# Patient Record
Sex: Female | Born: 1970 | Race: Black or African American | Hispanic: No | Smoking: Light tobacco smoker
Health system: Southern US, Community
[De-identification: ages and names within clinical notes are randomized; demographics above are authoritative.]

## PROBLEM LIST (undated history)

## (undated) DIAGNOSIS — R87619 Unspecified abnormal cytological findings in specimens from cervix uteri: Secondary | ICD-10-CM

## (undated) DIAGNOSIS — R7303 Prediabetes: Secondary | ICD-10-CM

## (undated) DIAGNOSIS — K859 Acute pancreatitis without necrosis or infection, unspecified: Secondary | ICD-10-CM

## (undated) DIAGNOSIS — M1990 Unspecified osteoarthritis, unspecified site: Secondary | ICD-10-CM

## (undated) DIAGNOSIS — E079 Disorder of thyroid, unspecified: Secondary | ICD-10-CM

## (undated) DIAGNOSIS — I10 Essential (primary) hypertension: Secondary | ICD-10-CM

## (undated) DIAGNOSIS — J45909 Unspecified asthma, uncomplicated: Secondary | ICD-10-CM

## (undated) DIAGNOSIS — J302 Other seasonal allergic rhinitis: Secondary | ICD-10-CM

## (undated) DIAGNOSIS — R87810 Cervical high risk human papillomavirus (HPV) DNA test positive: Secondary | ICD-10-CM

## (undated) HISTORY — PX: ABDOMINAL HYSTERECTOMY: SHX81

## (undated) HISTORY — DX: Cervical high risk human papillomavirus (HPV) DNA test positive: R87.810

## (undated) HISTORY — DX: Unspecified asthma, uncomplicated: J45.909

## (undated) HISTORY — DX: Unspecified abnormal cytological findings in specimens from cervix uteri: R87.619

## (undated) HISTORY — DX: Prediabetes: R73.03

## (undated) HISTORY — PX: TUBAL LIGATION: SHX77

## (undated) HISTORY — DX: Disorder of thyroid, unspecified: E07.9

---

## 2012-04-03 DIAGNOSIS — D219 Benign neoplasm of connective and other soft tissue, unspecified: Secondary | ICD-10-CM | POA: Insufficient documentation

## 2016-04-30 DIAGNOSIS — Z720 Tobacco use: Secondary | ICD-10-CM | POA: Insufficient documentation

## 2017-10-01 ENCOUNTER — Other Ambulatory Visit: Payer: Self-pay

## 2017-10-01 ENCOUNTER — Encounter: Payer: Self-pay | Admitting: Emergency Medicine

## 2017-10-01 DIAGNOSIS — M7918 Myalgia, other site: Secondary | ICD-10-CM | POA: Diagnosis present

## 2017-10-01 DIAGNOSIS — Z5321 Procedure and treatment not carried out due to patient leaving prior to being seen by health care provider: Secondary | ICD-10-CM | POA: Insufficient documentation

## 2017-10-01 NOTE — ED Triage Notes (Signed)
Pt presents to ED with worsening generalized body aches for the past couple of weeks. Pain is mostly in her hands, back, feet, and legs. Dx with arthritis several years ago but pt states pain is worsening. Pt states she goes to a pain doctor but the patch they have prescribed its not helping.

## 2017-10-02 ENCOUNTER — Emergency Department
Admission: EM | Admit: 2017-10-02 | Discharge: 2017-10-02 | Disposition: A | Discharge status: Home / Self Care | Payer: Medicaid Other | Attending: Emergency Medicine | Admitting: Emergency Medicine

## 2017-10-02 ENCOUNTER — Encounter: Payer: Self-pay | Admitting: Emergency Medicine

## 2017-10-02 DIAGNOSIS — F17210 Nicotine dependence, cigarettes, uncomplicated: Secondary | ICD-10-CM | POA: Insufficient documentation

## 2017-10-02 DIAGNOSIS — M7918 Myalgia, other site: Secondary | ICD-10-CM | POA: Diagnosis present

## 2017-10-02 DIAGNOSIS — M069 Rheumatoid arthritis, unspecified: Secondary | ICD-10-CM | POA: Insufficient documentation

## 2017-10-02 DIAGNOSIS — I10 Essential (primary) hypertension: Secondary | ICD-10-CM | POA: Insufficient documentation

## 2017-10-02 HISTORY — DX: Acute pancreatitis without necrosis or infection, unspecified: K85.90

## 2017-10-02 HISTORY — DX: Unspecified osteoarthritis, unspecified site: M19.90

## 2017-10-02 HISTORY — DX: Other seasonal allergic rhinitis: J30.2

## 2017-10-02 HISTORY — DX: Essential (primary) hypertension: I10

## 2017-10-02 MED ORDER — PREDNISONE 10 MG PO TABS: 10.0000 mg | ORAL_TABLET | Freq: Every day | ORAL | 0 refills | Status: DC

## 2017-10-02 MED ORDER — DEXAMETHASONE SODIUM PHOSPHATE 10 MG/ML IJ SOLN
10.0000 mg | Freq: Once | INTRAMUSCULAR | Status: AC
Start: 2017-10-02 — End: 2017-10-02
  Administered 2017-10-02: 10 mg via INTRAMUSCULAR
  Filled 2017-10-02: qty 1

## 2017-10-02 NOTE — ED Triage Notes (Signed)
Pt c/o generalized body pain as well as hand and foot pain x2 weeks without relief of OTC medication. Pt Dx with arthritis and sts pain is similar but has worsened on the last weeks. Pt has pain patch to the left upper arm and reports she has not had relief from the patch.

## 2017-10-02 NOTE — ED Provider Notes (Signed)
[facility] Emergency Department Provider Note  ____________________________________________  Time seen: Approximately 7:55 PM  I have reviewed the triage vital signs and the nursing notes.   HISTORY  Chief Complaint Generalized Body Aches; Hand Pain; and Foot Pain    HPI Amy West is a 46 y.o. female who presents emergency department complaining of multi-joint pain.  Patient reports that she has a history of rheumatoid arthritis which is managed by pain management.  Patient states that she is never seen rheumatology or endocrinology for her rheumatoid arthritis.  Patient has never been trialed on methotrexate or biological or non-biological agents.  Patient reports that she has flares frequently.  She is on Percocet by mouth as needed and pain patches chronically.  Patient is unable to take NSAIDs due to extreme GI upset.  No history of gastric ulcer or renal failure.  Patient denies any recent trauma to any extremity.  She reports that she is having increased pain to her bilateral hands and wrists, bilateral feet and ankle.  No other complaints of headache, visual changes, neck pain, chest pain, shortness of breath, abdominal pain, nausea or vomiting.  Patient is taking her prescribed pain medications as prescribed.  Past Medical History:  Diagnosis Date  . Arthritis   . Hypertension   . Pancreatitis   . Seasonal allergies     There are no active problems to display for this patient.   Past Surgical History:  Procedure Laterality Date  . ABDOMINAL HYSTERECTOMY    . CESAREAN SECTION    . TUBAL LIGATION      Prior to Admission medications   Medication Sig Start Date End Date Taking? Authorizing Provider  predniSONE (DELTASONE) 10 MG tablet Take 1 tablet (10 mg total) daily by mouth. 10/02/17   Hines Kloss, Delorise Royals, PA-C    Allergies Patient has no known allergies.  History reviewed. No pertinent family history.  Social History Social History    Tobacco Use  . Smoking status: Light Tobacco Smoker    Types: Cigarettes  . Smokeless tobacco: Never Used  Substance Use Topics  . Alcohol use: No    Frequency: Never  . Drug use: No     Review of Systems  Constitutional: No fever/chills Eyes: No visual changes.  Cardiovascular: no chest pain. Respiratory: no cough. No SOB. Gastrointestinal: No abdominal pain.  No nausea, no vomiting.  Musculoskeletal: Positive for rheumatoid arthritis, with bilateral hand, wrist, ankle, feet pain. Skin: Negative for rash, abrasions, lacerations, ecchymosis. Neurological: Negative for headaches, focal weakness or numbness. 10-point ROS otherwise negative.  ____________________________________________   PHYSICAL EXAM:  VITAL SIGNS: ED Triage Vitals  Enc Vitals Group     BP 10/02/17 1901 116/70     Pulse Rate 10/02/17 1901 90     Resp 10/02/17 1901 17     Temp 10/02/17 1901 98.1 F (36.7 C)     Temp Source 10/02/17 1901 Oral     SpO2 10/02/17 1901 100 %     Weight 10/02/17 1901 164 lb (74.4 kg)     Height --      Head Circumference --      Peak Flow --      Pain Score 10/02/17 1916 10     Pain Loc --      Pain Edu? --      Excl. in GC? --      Constitutional: Alert and oriented. Well appearing and in no acute distress. Eyes: Conjunctivae are normal. PERRL. EOMI. Head: Atraumatic.Marland Kitchen  Neck: No stridor.   Cardiovascular: Normal rate, regular rhythm. Normal S1 and S2.  Good peripheral circulation. Respiratory: Normal respiratory effort without tachypnea or retractions. Lungs CTAB. Good air entry to the bases with no decreased or absent breath sounds. Musculoskeletal: Full range of motion to all extremities. No gross deformities appreciated.  No deformities, edema, ecchymosis, abrasions or contusions noted to bilateral hands, wrists, ankles, feet.  Radial pulse intact bilateral upper extremities, dorsalis pedis pulse intact bilateral lower extremity.  Sensation all digits bilateral  upper and lower extremities.  No tenderness to palpation.  No palpable abnormalities. Neurologic:  Normal speech and language. No gross focal neurologic deficits are appreciated.  Skin:  Skin is warm, dry and intact. No rash noted. Psychiatric: Mood and affect are normal. Speech and behavior are normal. Patient exhibits appropriate insight and judgement.   ____________________________________________   LABS (all labs ordered are listed, but only abnormal results are displayed)  Labs Reviewed - No data to display ____________________________________________  EKG   ____________________________________________  RADIOLOGY   No results found.  ____________________________________________    PROCEDURES  Procedure(s) performed:    Procedures    Medications  dexamethasone (DECADRON) injection 10 mg (not administered)     ____________________________________________   INITIAL IMPRESSION / ASSESSMENT AND PLAN / ED COURSE  Pertinent labs & imaging results that were available during my care of the patient were reviewed by me and considered in my medical decision making (see chart for details).  Review of the  [place]RS was performed in accordance of the [facility] prior to dispensing any controlled drugs.     Patient's diagnosis is consistent with rheumatoid arthritis flare.  Patient is here for increased pain from her rheumatoid arthritis.  Exam is reassuring with no indication of infection, acute injury.  Differential included osteoarthritis, rheumatoid arthritis, septic joint, gout.  No indication for labs or imaging at this time.  Discussed at length with patient about rheumatology and endocrinology and possible management techniques.  At this time, patient will follow-up with rheumatology or endocrinology as referred by her primary care.  Patient has Medicaid and referral will have to come from primary care.  Patient is given injection of steroids in the emergency department and  will be prescribed a prednisone taper for acute flare.  Patient will follow up with primary care and ultimately rheumatology or endocrinology..  Patient is given ED precautions to return to the ED for any worsening or new symptoms.     ____________________________________________  FINAL CLINICAL IMPRESSION(S) / ED DIAGNOSES  Final diagnoses:  Rheumatoid arthritis flare (HCC)      NEW MEDICATIONS STARTED DURING THIS VISIT:  ED Discharge Orders        Ordered    predniSONE (DELTASONE) 10 MG tablet  Daily    Comments:  Take 6 pills x 2 days, 5 pills x 2 days, 4 pills x 2 days, 3 pills x 2 days, 2 pills x 2 days, and 1 pill x 2 days   10/02/17 2010          This chart was dictated using voice recognition software/Dragon. Despite best efforts to proofread, errors can occur which can change the meaning. Any change was purely unintentional.    Racheal Patches, PA-C 10/02/17 2014    Phineas Semen, MD 10/02/17 2108

## 2017-10-14 ENCOUNTER — Ambulatory Visit: Payer: Medicaid Other | Admitting: Family Medicine

## 2017-10-16 ENCOUNTER — Ambulatory Visit: Payer: Medicaid Other | Admitting: Unknown Physician Specialty

## 2017-10-16 ENCOUNTER — Ambulatory Visit (INDEPENDENT_AMBULATORY_CARE_PROVIDER_SITE_OTHER): Payer: Medicaid Other | Admitting: Unknown Physician Specialty

## 2017-10-16 ENCOUNTER — Encounter: Payer: Self-pay | Admitting: Unknown Physician Specialty

## 2017-10-16 DIAGNOSIS — M2550 Pain in unspecified joint: Secondary | ICD-10-CM | POA: Diagnosis not present

## 2017-10-16 DIAGNOSIS — F321 Major depressive disorder, single episode, moderate: Secondary | ICD-10-CM | POA: Diagnosis not present

## 2017-10-16 DIAGNOSIS — G894 Chronic pain syndrome: Secondary | ICD-10-CM

## 2017-10-16 DIAGNOSIS — R5383 Other fatigue: Secondary | ICD-10-CM | POA: Diagnosis not present

## 2017-10-16 DIAGNOSIS — G8929 Other chronic pain: Secondary | ICD-10-CM | POA: Insufficient documentation

## 2017-10-16 DIAGNOSIS — N761 Subacute and chronic vaginitis: Secondary | ICD-10-CM | POA: Diagnosis not present

## 2017-10-16 LAB — WET PREP FOR TRICH, YEAST, CLUE
Clue Cell Exam: POSITIVE — AB
Trichomonas Exam: NEGATIVE
Yeast Exam: NEGATIVE

## 2017-10-16 MED ORDER — METRONIDAZOLE 500 MG PO TABS: 500.0000 mg | ORAL_TABLET | Freq: Three times a day (TID) | ORAL | 1 refills | Status: DC

## 2017-10-16 MED ORDER — DULOXETINE HCL 60 MG PO CPEP: 60.0000 mg | ORAL_CAPSULE | Freq: Every day | ORAL | 1 refills | Status: DC

## 2017-10-16 MED ORDER — FLUCONAZOLE 150 MG PO TABS: 150.0000 mg | ORAL_TABLET | Freq: Once | ORAL | 12 refills | Status: AC

## 2017-10-16 NOTE — Assessment & Plan Note (Signed)
Positive clue cells.  Rx for Metronidazole with a refill.  Chronic issues.  Diflucan with refills

## 2017-10-16 NOTE — Progress Notes (Signed)
BP 103/64   Pulse 91   Temp 98.3 F (36.8 C) (Oral)   Ht 5' 4.1" (1.628 m)   Wt 167 lb 11.2 oz (76.1 kg)   LMP  (LMP Unknown)   SpO2 95%   BMI 28.70 kg/m    Subjective:    Patient ID: Amy West, female    DOB: 1971/06/04, 46 y.o.   MRN: [id number]  HPI: Amy West is a 46 y.o. female  Chief Complaint  Patient presents with  . Establish Care    pt states she is still having vaginal itching, went to urgent care about 2 weeks go and was given amoxicillin  . Labs Only    pt is requesting to have her thyroid checked   Chief complaint today is hand and feet swelling secondary to RA.  Gets a patch and percocet at the pain clinic.  She presented to the ER and received and received steroids.  Helped some.  States a "dull pain" sits there "throughy the bone."  Gets Methadone, Butrans, and Oxycodone through the pain clinic and doses are adjusted.    Vaginal Itching  The patient's primary symptoms include genital itching. Primary symptoms comment: Treated for BV 2 weeks ago. This is a recurrent problem. The problem occurs constantly. The problem has been gradually improving. The problem affects both sides. She is not pregnant. Pertinent negatives include no abdominal pain, constipation, fever, rash, urgency or vomiting. Nothing aggravates the symptoms. She is sexually active (one partner). No, her partner does not have an STD. She uses hysterectomy for contraception. Her past medical history is significant for vaginosis.   Depression Takes Sertraline and needs a refill.  Has tried Prozac and Wellbutrin before.  Feels her depression is under fair control  Depression screen [facility] 2/9 10/16/2017  Decreased Interest 3  Down, Depressed, Hopeless 0  PHQ - 2 Score 3  Altered sleeping 0  Tired, decreased energy 3  Change in appetite 1  Feeling bad or failure about yourself  3  Trouble concentrating 0  Moving slowly or fidgety/restless 3  Suicidal thoughts 0  PHQ-9 Score 13   Relevant  past medical, surgical, family and social history reviewed and updated as indicated. Interim medical history since our last visit reviewed. Allergies and medications reviewed and updated.  Review of Systems  Constitutional: Positive for fatigue. Negative for fever.  HENT: Negative.   Eyes: Negative.   Respiratory: Negative.   Cardiovascular: Negative.   Gastrointestinal: Negative for abdominal pain, constipation and vomiting.  Endocrine: Negative.   Genitourinary: Negative for urgency.  Musculoskeletal: Positive for arthralgias, joint swelling and myalgias.  Skin: Negative for rash.    Per HPI unless specifically indicated above     Objective:    BP 103/64   Pulse 91   Temp 98.3 F (36.8 C) (Oral)   Ht 5' 4.1" (1.628 m)   Wt 167 lb 11.2 oz (76.1 kg)   LMP  (LMP Unknown)   SpO2 95%   BMI 28.70 kg/m   Wt Readings from Last 3 Encounters:  10/16/17 167 lb 11.2 oz (76.1 kg)  10/02/17 164 lb (74.4 kg)  10/01/17 164 lb (74.4 kg)    Physical Exam  Constitutional: She is oriented to person, place, and time. She appears well-developed and well-nourished. No distress.  HENT:  Head: Normocephalic and atraumatic.  Eyes: Conjunctivae and lids are normal. Right eye exhibits no discharge. Left eye exhibits no discharge. No scleral icterus.  Neck: Normal range of motion. Neck supple.  No JVD present. Carotid bruit is not present.  Cardiovascular: Normal rate, regular rhythm and normal heart sounds.  Pulmonary/Chest: Effort normal and breath sounds normal.  Abdominal: Normal appearance. There is no splenomegaly or hepatomegaly.  Genitourinary: Vaginal discharge found.  Musculoskeletal: Normal range of motion.  Neurological: She is alert and oriented to person, place, and time.  Skin: Skin is warm, dry and intact. No rash noted. No pallor.  Psychiatric: She has a normal mood and affect. Her behavior is normal. Judgment and thought content normal.    No results found for this or any  previous visit.    Assessment & Plan:   Problem List Items Addressed This Visit      Unprioritized   Chronic pain    Per pain clinic.        Relevant Medications   oxyCODONE-acetaminophen (PERCOCET) 10-325 MG tablet   BUTRANS 15 MCG/HR PTWK   sertraline (ZOLOFT) 50 MG tablet   methadone (DOLOPHINE) 10 MG tablet   DULoxetine (CYMBALTA) 60 MG capsule   Chronic vaginitis    Positive clue cells.  Rx for Metronidazole with a refill.  Chronic issues.  Diflucan with refills      Relevant Orders   WET PREP FOR TRICH, YEAST, CLUE   Depression, major, single episode, moderate ([place])    Change from Sertraline to Cymbalta due to chronic pain      Relevant Medications   sertraline (ZOLOFT) 50 MG tablet   DULoxetine (CYMBALTA) 60 MG capsule   Other Relevant Orders   TSH   Fatigue   Joint pain    Hand and feet.  States RA has been diagnosed in the past.  Have no labs.  Check today.  Refer if appropriate      Relevant Orders   CBC with Differential/Platelet   Comprehensive metabolic panel   TSH   Sed Rate (ESR)   ANA w/Reflex   Rheumatoid Arthritis Profile       Follow up plan: Return in about 4 weeks (around 11/13/2017).

## 2017-10-16 NOTE — Assessment & Plan Note (Signed)
Change from Sertraline to Cymbalta due to chronic pain

## 2017-10-16 NOTE — Assessment & Plan Note (Addendum)
Hand and feet.  States RA has been diagnosed in the past.  Have no labs.  Check today.  Refer if appropriate

## 2017-10-16 NOTE — Assessment & Plan Note (Addendum)
Per pain clinic 

## 2017-10-17 ENCOUNTER — Encounter: Payer: Self-pay | Admitting: Unknown Physician Specialty

## 2017-10-18 LAB — COMPREHENSIVE METABOLIC PANEL
A/G RATIO: 1.8 (ref 1.2–2.2)
ALT: 10 IU/L (ref 0–32)
AST: 15 IU/L (ref 0–40)
Albumin: 4.1 g/dL (ref 3.5–5.5)
Alkaline Phosphatase: 86 IU/L (ref 39–117)
BUN/Creatinine Ratio: 18 (ref 9–23)
BUN: 10 mg/dL (ref 6–24)
Bilirubin Total: 0.3 mg/dL (ref 0.0–1.2)
CALCIUM: 9.2 mg/dL (ref 8.7–10.2)
CO2: 25 mmol/L (ref 20–29)
CREATININE: 0.56 mg/dL — AB (ref 0.57–1.00)
Chloride: 104 mmol/L (ref 96–106)
GFR, EST AFRICAN AMERICAN: 128 mL/min/{1.73_m2} (ref >59)
GFR, EST NON AFRICAN AMERICAN: 111 mL/min/{1.73_m2} (ref >59)
Globulin, Total: 2.3 g/dL (ref 1.5–4.5)
Glucose: 74 mg/dL (ref 65–99)
POTASSIUM: 4.3 mmol/L (ref 3.5–5.2)
Sodium: 141 mmol/L (ref 134–144)
TOTAL PROTEIN: 6.4 g/dL (ref 6.0–8.5)

## 2017-10-18 LAB — SEDIMENTATION RATE: SED RATE: 13 mm/h (ref 0–32)

## 2017-10-18 LAB — CBC WITH DIFFERENTIAL/PLATELET
BASOS ABS: 0 10*3/uL (ref 0.0–0.2)
Basos: 0 %
EOS (ABSOLUTE): 0.4 10*3/uL (ref 0.0–0.4)
Eos: 4 %
HEMATOCRIT: 39.2 % (ref 34.0–46.6)
Hemoglobin: 12.3 g/dL (ref 11.1–15.9)
IMMATURE GRANS (ABS): 0 10*3/uL (ref 0.0–0.1)
IMMATURE GRANULOCYTES: 0 %
LYMPHS: 31 %
Lymphocytes Absolute: 3.1 10*3/uL (ref 0.7–3.1)
MCH: 26.8 pg (ref 26.6–33.0)
MCHC: 31.4 g/dL — ABNORMAL LOW (ref 31.5–35.7)
MCV: 85 fL (ref 79–97)
MONOCYTES: 11 %
Monocytes Absolute: 1.2 10*3/uL — ABNORMAL HIGH (ref 0.1–0.9)
NEUTROS PCT: 54 %
Neutrophils Absolute: 5.5 10*3/uL (ref 1.4–7.0)
PLATELETS: 366 10*3/uL (ref 150–379)
RBC: 4.59 x10E6/uL (ref 3.77–5.28)
RDW: 15.8 % — AB (ref 12.3–15.4)
WBC: 10.3 10*3/uL (ref 3.4–10.8)

## 2017-10-18 LAB — RHEUMATOID ARTHRITIS PROFILE: CYCLIC CITRULLIN PEPTIDE AB: 4 U (ref 0–19)

## 2017-10-18 LAB — TSH: TSH: 0.602 u[IU]/mL (ref 0.450–4.500)

## 2017-10-18 LAB — ANA W/REFLEX: ANA: NEGATIVE

## 2017-10-21 ENCOUNTER — Telehealth: Payer: Self-pay

## 2017-10-21 NOTE — Telephone Encounter (Signed)
An MRI won't be approved for her pain everywhere.  If would need to be specific and persistent.  Rheumatology would be the next step.  But they may not take the referral.  We can try

## 2017-10-21 NOTE — Telephone Encounter (Signed)
Routing to provider  

## 2017-10-21 NOTE — Telephone Encounter (Signed)
Pt called back results given to pt per notes of Brittnnt,CMA on 12/3. Pt verbalized understanding. Pt wants to know if MRI could be done regarding test results showing no signs of rheumatoid arthritis. Pt states she has been diagnosed with rheumatoid arthritis for years.Flow coordinator, Ephriam Knuckles contacted at the office about this matter.

## 2017-10-21 NOTE — Telephone Encounter (Signed)
Tried calling patient to let her know what Amy West said. Patient did not answer so I left a VM asking for patient to please call back. OK for PEC to let the patient know what Amy West said when she calls back. Also find out if the patient is ok with going to rheumatology please.

## 2017-10-21 NOTE — Telephone Encounter (Signed)
Copied from CRM [id number]. Topic: Quick Communication - Lab Results >> Oct 21, 2017 10:21 AM Debroah Loop wrote: Called patient to inform them of n/a lab results. Saw Gabriel Cirri for new pt appt and wants lab results.   Attempted to reach patient to let her know about results. A letter was generated and sent to patient by Gabriel Cirri, FNP with results. [name] states on the letter, "these are normal results, showing no signs of rheumatoid arthritis." Patient did not answer and no VM came up to leave a VM. OK for PEC to let patient know the information stated above if she returns the call.

## 2017-10-22 ENCOUNTER — Encounter (INDEPENDENT_AMBULATORY_CARE_PROVIDER_SITE_OTHER): Payer: Self-pay

## 2017-10-22 ENCOUNTER — Ambulatory Visit (INDEPENDENT_AMBULATORY_CARE_PROVIDER_SITE_OTHER): Payer: Medicaid Other | Admitting: Unknown Physician Specialty

## 2017-10-22 ENCOUNTER — Encounter: Payer: Self-pay | Admitting: Unknown Physician Specialty

## 2017-10-22 ENCOUNTER — Telehealth: Payer: Self-pay | Admitting: Unknown Physician Specialty

## 2017-10-22 VITALS — BP 127/82 | HR 76 | Temp 98.5°F | Wt 166.4 lb

## 2017-10-22 DIAGNOSIS — G894 Chronic pain syndrome: Secondary | ICD-10-CM | POA: Diagnosis not present

## 2017-10-22 DIAGNOSIS — F321 Major depressive disorder, single episode, moderate: Secondary | ICD-10-CM

## 2017-10-22 DIAGNOSIS — M7989 Other specified soft tissue disorders: Secondary | ICD-10-CM

## 2017-10-22 DIAGNOSIS — N9089 Other specified noninflammatory disorders of vulva and perineum: Secondary | ICD-10-CM | POA: Diagnosis not present

## 2017-10-22 MED ORDER — LIDOCAINE 0.5 % EX GEL: CUTANEOUS | 1 refills | Status: DC

## 2017-10-22 NOTE — Assessment & Plan Note (Signed)
Sick with Cymbalta.  Give bottle of 30 mg then transition to 60 mg.

## 2017-10-22 NOTE — Telephone Encounter (Signed)
Let pt. Know it is OTC
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
## 2017-10-22 NOTE — Telephone Encounter (Signed)
Routing to provider  

## 2017-10-22 NOTE — Telephone Encounter (Signed)
Copied from CRM [id number]. Topic: Inquiry >> Oct 22, 2017 11:35 AM Alexander Bergeron B wrote: Reason for CRM: ellen from [facility] in [place] said that the lidocaine is not available, and is wanting Dr. Jamesetta Orleans to send in another Rx for them to fill

## 2017-10-22 NOTE — Telephone Encounter (Signed)
Patient notified. See OV note from today.

## 2017-10-22 NOTE — Assessment & Plan Note (Addendum)
Refer to rheumatology for past diagnsis of RA and swelling of hands.

## 2017-10-23 ENCOUNTER — Telehealth: Payer: Self-pay | Admitting: Unknown Physician Specialty

## 2017-10-23 LAB — HSV(HERPES SIMPLEX VRS) I + II AB-IGG
HSV 1 Glycoprotein G Ab, IgG: 23.2 index — ABNORMAL HIGH (ref 0.00–0.90)
HSV 2 IgG, Type Spec: 9.16 index — ABNORMAL HIGH (ref 0.00–0.90)

## 2017-10-23 LAB — RPR: RPR: NONREACTIVE

## 2017-10-23 LAB — LYME AB/WESTERN BLOT REFLEX: LYME DISEASE AB, QUANT, IGM: <0.8 index (ref 0.00–0.79)

## 2017-10-23 LAB — HIV ANTIBODY (ROUTINE TESTING W REFLEX): HIV Screen 4th Generation wRfx: NONREACTIVE

## 2017-10-23 MED ORDER — VALACYCLOVIR HCL 500 MG PO TABS: 500.0000 mg | ORAL_TABLET | Freq: Two times a day (BID) | ORAL | 0 refills | Status: DC

## 2017-10-23 NOTE — Progress Notes (Signed)
Patient notified of results by phone.

## 2017-10-23 NOTE — Telephone Encounter (Signed)
Discussed with pt positve herpes titers.  Discussed with pt positive herpes.

## 2017-10-23 NOTE — Telephone Encounter (Signed)
Patient notified

## 2017-11-05 ENCOUNTER — Telehealth: Payer: Self-pay

## 2017-11-05 MED ORDER — VALACYCLOVIR HCL 500 MG PO TABS: 500.0000 mg | ORAL_TABLET | Freq: Every day | ORAL | 3 refills | Status: DC

## 2017-11-05 NOTE — Telephone Encounter (Signed)
Patient notified

## 2017-11-05 NOTE — Telephone Encounter (Signed)
She has andtibodies for both I and II which doesn't mean she has an outbreak.  Medication can be continued to help prevent transmission or discontinued and take as needed.

## 2017-11-05 NOTE — Telephone Encounter (Signed)
Copied from CRM [id number]. Topic: Quick Communication - Lab Results >> Nov 04, 2017  1:45 PM Amy West wrote: Pt has more questions concerning lab results from 10-22-17   Called and spoke to patient. Patient wants to know what herpes she has, 1 or 2. Patient also wants to know what she needs to do after completing the medication that she was put on for the herpes.

## 2017-11-05 NOTE — Telephone Encounter (Signed)
Folic acid and MVI is OTC.

## 2017-11-05 NOTE — Telephone Encounter (Signed)
Called and let patient know what [name] said. Patient wants to stay on the medication continuously so she would like a prescription sent in for it to [facility]. Patient also states that she forgot to mention to [name] that she has been taking prescription folic acid and prescription vitamin. Patient wants to know if she can have these sent in as well.

## 2017-11-25 ENCOUNTER — Telehealth: Payer: Self-pay | Admitting: Unknown Physician Specialty

## 2017-11-25 NOTE — Telephone Encounter (Signed)
Patient notified

## 2017-11-25 NOTE — Telephone Encounter (Signed)
Yes, if they do subside

## 2017-11-25 NOTE — Telephone Encounter (Signed)
Copied from CRM [id number]. Topic: Inquiry >> Nov 25, 2017 11:17 AM Amy West, NT wrote: Reason for CRM: Patient called to speak with Gabriel Cirri about her headaches and weight lost. If she could give her a call back at [phone]

## 2017-11-25 NOTE — Telephone Encounter (Signed)
No, not a normal side-effect.  I would stop the Valtrex and see if the symptoms go away

## 2017-11-25 NOTE — Telephone Encounter (Signed)
Is there another medication the patient can take after symptoms subside?

## 2017-11-25 NOTE — Telephone Encounter (Signed)
Called and spoke to patient. She states that since starting the valtrex, she has been having frequent headaches that make her sleep all day and loosing weight. Patient would like to know if these are normal side effects and what she should do.

## 2017-12-10 ENCOUNTER — Ambulatory Visit: Payer: Medicaid Other | Admitting: Unknown Physician Specialty

## 2017-12-13 ENCOUNTER — Ambulatory Visit: Payer: Medicaid Other | Admitting: Unknown Physician Specialty

## 2017-12-16 ENCOUNTER — Encounter: Payer: Medicaid Other | Admitting: Unknown Physician Specialty

## 2018-01-27 ENCOUNTER — Telehealth: Payer: Self-pay | Admitting: Unknown Physician Specialty

## 2018-01-27 MED ORDER — SERTRALINE HCL 50 MG PO TABS: 50.0000 mg | ORAL_TABLET | Freq: Every day | ORAL | 3 refills | Status: DC

## 2018-01-27 NOTE — Telephone Encounter (Signed)
Copied from CRM [id number]. Topic: Quick Communication - See Telephone Encounter >> Jan 27, 2018  2:46 PM Windy Kalata, NT wrote: CRM for notification. See Telephone encounter for:  01/27/18.  Patient is calling and is requesting Gabriel Cirri nurse give her a call. She states DULoxetine (CYMBALTA) 60 MG capsules is not working for her and would like to be put back on Zoloft. Please contact patient.  [id number] - [place], [place] - [address] AT [facility] OF [address]  [address] [place] [address]  Phone: [phone] Fax: [phone]

## 2018-01-27 NOTE — Telephone Encounter (Signed)
D/c'd cymbalta and sent in previous dose of zoloft for her to go back on

## 2018-01-27 NOTE — Telephone Encounter (Signed)
Patient notified

## 2018-03-02 ENCOUNTER — Encounter: Payer: Self-pay | Admitting: Emergency Medicine

## 2018-03-02 ENCOUNTER — Emergency Department
Admission: EM | Admit: 2018-03-02 | Discharge: 2018-03-02 | Disposition: A | Discharge status: Home / Self Care | Payer: Medicaid Other | Attending: Emergency Medicine | Admitting: Emergency Medicine

## 2018-03-02 ENCOUNTER — Emergency Department: Payer: Medicaid Other

## 2018-03-02 DIAGNOSIS — M79604 Pain in right leg: Secondary | ICD-10-CM

## 2018-03-02 DIAGNOSIS — Y929 Unspecified place or not applicable: Secondary | ICD-10-CM | POA: Insufficient documentation

## 2018-03-02 DIAGNOSIS — E039 Hypothyroidism, unspecified: Secondary | ICD-10-CM | POA: Diagnosis not present

## 2018-03-02 DIAGNOSIS — W010XXA Fall on same level from slipping, tripping and stumbling without subsequent striking against object, initial encounter: Secondary | ICD-10-CM | POA: Insufficient documentation

## 2018-03-02 DIAGNOSIS — Z79899 Other long term (current) drug therapy: Secondary | ICD-10-CM | POA: Insufficient documentation

## 2018-03-02 DIAGNOSIS — F17210 Nicotine dependence, cigarettes, uncomplicated: Secondary | ICD-10-CM | POA: Insufficient documentation

## 2018-03-02 DIAGNOSIS — I10 Essential (primary) hypertension: Secondary | ICD-10-CM | POA: Diagnosis not present

## 2018-03-02 DIAGNOSIS — J45909 Unspecified asthma, uncomplicated: Secondary | ICD-10-CM | POA: Insufficient documentation

## 2018-03-02 DIAGNOSIS — Y939 Activity, unspecified: Secondary | ICD-10-CM | POA: Insufficient documentation

## 2018-03-02 DIAGNOSIS — M79661 Pain in right lower leg: Secondary | ICD-10-CM | POA: Diagnosis present

## 2018-03-02 DIAGNOSIS — W19XXXA Unspecified fall, initial encounter: Secondary | ICD-10-CM

## 2018-03-02 DIAGNOSIS — Y999 Unspecified external cause status: Secondary | ICD-10-CM | POA: Insufficient documentation

## 2018-03-02 MED ORDER — IBUPROFEN 600 MG PO TABS
600.0000 mg | ORAL_TABLET | Freq: Once | ORAL | Status: AC
  Administered 2018-03-02: 600 mg via ORAL
  Filled 2018-03-02: qty 1

## 2018-03-02 MED ORDER — IBUPROFEN 600 MG PO TABS: 600.0000 mg | ORAL_TABLET | Freq: Four times a day (QID) | ORAL | 0 refills | Status: DC | PRN

## 2018-03-02 NOTE — Discharge Instructions (Addendum)
Please rest ice and elevate the lower extremity.  Take ibuprofen every 8 hours as needed for pain with food for 1 week.  Follow-up with PCP or orthopedics if no improvement 1 week.

## 2018-03-02 NOTE — ED Provider Notes (Signed)
[facility] EMERGENCY DEPARTMENT Provider Note   CSN: [id number] Arrival date & time: 03/02/18  1152     History   Chief Complaint Chief Complaint  Patient presents with  . Fall  . Leg Pain    HPI Amy West is a 47 y.o. female.  Midshaft of the tib-fib region.  States that she fell this morning after slipping.  She denies any ankle or knee pain.  No hip pain.  No other injury to her body.  Pain is 3 out of 10.  She is ambulatory but with sharp pain when she walks. Patient has not had any medications for pain. HPI  Past Medical History:  Diagnosis Date  . Arthritis   . Asthma   . Hypertension   . Pancreatitis   . Pre-diabetes   . Seasonal allergies   . Thyroid disease    hypothyroid    Patient Active Problem List   Diagnosis Date Noted  . Chronic pain 10/16/2017  . Chronic vaginitis 10/16/2017  . Depression, major, single episode, moderate (HCC) 10/16/2017  . Joint pain 10/16/2017  . Fatigue 10/16/2017    Past Surgical History:  Procedure Laterality Date  . ABDOMINAL HYSTERECTOMY    . CESAREAN SECTION    . TUBAL LIGATION       OB History   None      Home Medications    Prior to Admission medications   Medication Sig Start Date End Date Taking? Authorizing Provider  BUTRANS 15 MCG/HR PTWK PLACE ONE PATCH ONTO THE SKIN EVERY 7 DAYS 10/14/17   [provider]  hydrochlorothiazide (HYDRODIURIL) 25 MG tablet Take 25 mg by mouth daily.    [provider]  ibuprofen (ADVIL,MOTRIN) 600 MG tablet Take 1 tablet (600 mg total) by mouth every 6 (six) hours as needed for moderate pain. 03/02/18   Evon Slack, PA-C  Lidocaine 0.5 % GEL Apply pea sized amount every 2 hours as needed 10/22/17   Gabriel Cirri, NP  methadone (DOLOPHINE) 10 MG tablet Take 10 mg by mouth every 8 (eight) hours.    [provider]  metroNIDAZOLE (FLAGYL) 500 MG tablet Take 1 tablet (500 mg total) by mouth 3 (three) times daily. 10/16/17    Gabriel Cirri, NP  oxyCODONE-acetaminophen (PERCOCET) 10-325 MG tablet TAKE ONE TABLET BY MOUTH 1-2 TIMES DAILY FOR 31 DAYS 09/02/17   [provider]  sertraline (ZOLOFT) 50 MG tablet Take 1 tablet (50 mg total) by mouth daily. 01/27/18   Particia Nearing, PA-C  valACYclovir (VALTREX) 500 MG tablet Take 1 tablet (500 mg total) by mouth daily. 11/05/17   Gabriel Cirri, NP    Family History Family History  Problem Relation Age of Onset  . Cancer Mother        lung  . Hypertension Mother   . Diabetes Mother   . Hypertension Father     Social History Social History   Tobacco Use  . Smoking status: Light Tobacco Smoker    Types: Cigarettes  . Smokeless tobacco: Never Used  Substance Use Topics  . Alcohol use: No    Frequency: Never  . Drug use: No     Allergies   Patient has no known allergies.   Review of Systems Review of Systems  Constitutional: Negative for activity change.  Eyes: Negative for pain and visual disturbance.  Respiratory: Negative for shortness of breath.   Cardiovascular: Negative for chest pain and leg swelling.  Gastrointestinal: Negative for abdominal pain.  Genitourinary: Negative for flank pain and pelvic pain.  Musculoskeletal: Positive for myalgias. Negative for arthralgias, gait problem, joint swelling, neck pain and neck stiffness.  Skin: Negative for wound.  Neurological: Negative for dizziness, syncope, weakness, light-headedness, numbness and headaches.  Psychiatric/Behavioral: Negative for confusion and decreased concentration.     Physical Exam Updated Vital Signs BP 129/84 (BP Location: Left Arm)   Pulse 81   Temp 98.2 F (36.8 C) (Oral)   Resp 16   Ht 5\' 3"  (1.6 m)   Wt 72.6 kg (160 lb)   LMP  (LMP Unknown)   SpO2 96%   BMI 28.34 kg/m   Physical Exam  Constitutional: She is oriented to person, place, and time. She appears well-developed and well-nourished.  HENT:  Head: Normocephalic and atraumatic.    Right Ear: External ear normal.  Left Ear: External ear normal.  Nose: Nose normal.  Eyes: Pupils are equal, round, and reactive to light. Conjunctivae and EOM are normal.  Neck: Normal range of motion.  Cardiovascular: Normal rate.  Pulmonary/Chest: Effort normal and breath sounds normal. No respiratory distress.  Abdominal: Soft. There is no tenderness.  Musculoskeletal: Normal range of motion. She exhibits no edema or deformity.  Tenderness palpation along the mid tib-fib region along the anterior tibia as well as the lateral part of the lower leg along the fibular shaft.  No swelling noted.  She is nontender to palpation throughout the ankle or knee.  There is no swelling throughout the knee or ankle.  She is ambulatory with minimal antalgic gait.  Neurological: She is alert and oriented to person, place, and time. No cranial nerve deficit. Coordination normal.  Skin: Skin is warm and dry. No rash noted.  Psychiatric: She has a normal mood and affect. Her behavior is normal.     ED Treatments / Results  Labs (all labs ordered are listed, but only abnormal results are displayed) Labs Reviewed - No data to display  EKG None  Radiology Dg Tibia/fibula Right  Result Date: 03/02/2018 CLINICAL DATA:  RIGHT leg pain after mechanical fall this morning. EXAM: RIGHT TIBIA AND FIBULA - 2 VIEW COMPARISON:  None. FINDINGS: There is no evidence of fracture or other focal bone lesions. Soft tissues are unremarkable. IMPRESSION: Negative. Electronically Signed   By: Bary Richard M.D.   On: 03/02/2018 13:10    Procedures Procedures (including critical care time)  Medications Ordered in ED Medications  ibuprofen (ADVIL,MOTRIN) tablet 600 mg (600 mg Oral Given 03/02/18 1318)     Initial Impression / Assessment and Plan / ED Course  I have reviewed the triage vital signs and the nursing notes.  Pertinent labs & imaging results that were available during my care of the patient were  reviewed by me and considered in my medical decision making (see chart for details).     47 year old female who slipped earlier today and has some pain in the right lower leg.  Pain is mild she is ambulatory with minimal antalgic gait.  Exam is unremarkable.  X-rays ordered and reviewed by me today show no evidence of fracture to the tib-fib region.  She is educated on signs and symptoms return to ED for.  She will take ibuprofen as needed.  Final Clinical Impressions(s) / ED Diagnoses   Final diagnoses:  Fall, initial encounter  Right leg pain    ED Discharge Orders        Ordered    ibuprofen (ADVIL,MOTRIN) 600 MG tablet  Every 6 hours  PRN     03/02/18 1319       Evon Slack, PA-C 03/02/18 1323    Darci Current, MD 03/02/18 1331

## 2018-03-02 NOTE — ED Triage Notes (Signed)
Pt comes into the ED via POC c/o right leg pain after she had a mechanical fall this morning.  Patient is currently ambulatory to triage and in NAD.  Patient has no deformity noted.

## 2018-03-02 NOTE — ED Notes (Signed)

## 2018-03-03 ENCOUNTER — Ambulatory Visit: Payer: Self-pay | Admitting: *Deleted

## 2018-03-03 NOTE — Telephone Encounter (Signed)
Scheduled appointment 4/19

## 2018-03-03 NOTE — Telephone Encounter (Signed)
Called in c/o having "blood pouring from her anus" this morning but said the BM was formed without blood in it.   She has herpes and was wondering,  "If this is normal with herpes?"   I let her know it was not normal with or without herpes.    See triage notes below.  I spoke with the flow coordinator as all the providers are booked today and protocol is to be seen within 24 hours.    The office is going to call her back.   I let the pt know this and she verbalized understanding and was agreeable to this plan.  I routed the information to the office.  Reason for Disposition . MODERATE rectal bleeding (small blood clots, passing blood without stool, or toilet water turns red)  Answer Assessment - Initial Assessment Questions 1. APPEARANCE of BLOOD: "What color is it?" "Is it passed separately, on the surface of the stool, or mixed in with the stool?"      I had a BM yesterday.   I thought it was the runs but it was blood out of my anus.   I have herpes.   2. AMOUNT: "How much blood was passed?"      It was not in the BM.   I could hear it pouring into the toilet.  It was blood. 3. FREQUENCY: "How many times has blood been passed with the stools?"      It's happened twice.  It also happened a month ago.   I get it on the toilet paper. 4. ONSET: "When was the blood first seen in the stools?" (Days or weeks)      A month ago it first happened.   Then yesterday it poured into the toilet when I went to the bathroom. 5. DIARRHEA: "Is there also some diarrhea?" If so, ask: "How many diarrhea stools were passed in past 24 hours?"      Stool is formed but blood was pouring out. 6. CONSTIPATION: "Do you have constipation?" If so, "How bad is it?"     No 7. RECURRENT SYMPTOMS: "Have you had blood in your stools before?" If so, ask: "When was the last time?" and "What happened that time?"      No 8. BLOOD THINNERS: "Do you take any blood thinners?" (e.g., Coumadin/warfarin, Pradaxa/dabigatran, aspirin)    No 9. OTHER SYMPTOMS: "Do you have any other symptoms?"  (e.g., abdominal pain, vomiting, dizziness, fever)     No abd pain.   My stomach hurts when I have to go to the bathroom. 10. PREGNANCY: "Is there any chance you are pregnant?" "When was your last menstrual period?"       No.   Had hysterectomy  Protocols used: RECTAL BLEEDING-A-AH

## 2018-03-03 NOTE — Telephone Encounter (Addendum)
It looks like she should be seen to check a CBC and schedule an appointment with GI.

## 2018-03-03 NOTE — Telephone Encounter (Signed)
Please call patient and scheduled an office visit with [facility] ASAP. Thanks.

## 2018-03-07 ENCOUNTER — Ambulatory Visit: Payer: Medicaid Other | Admitting: Unknown Physician Specialty

## 2018-04-28 ENCOUNTER — Telehealth: Payer: Self-pay | Admitting: Unknown Physician Specialty

## 2018-04-28 NOTE — Progress Notes (Signed)
This encounter was created in error - please disregard.

## 2018-04-28 NOTE — Telephone Encounter (Signed)
Called pt regarding rx for Zyrtec and Albuterol inhaler. Pt also requesting refill of steroid inhaler but does not know the name of the medication. Pt asked to call the office back with the name of the other inhaler. Pt states these prescriptions were previously filled by [facility] physician.   LOV 10/22/17-acute visit, medications not discussed during visit PCP: Phineas Inches  [facility]

## 2018-04-28 NOTE — Telephone Encounter (Signed)
Copied from CRM [id number]. Topic: Quick Communication - Rx Refill/Question >> Apr 28, 2018  1:32 PM Maia Petties wrote: Medication: zyrtec - pt was getting ordered by previous provider She also states she needs "asthma pumps" - advised her to call her pharmacy and notify them of change in provider and to send [place] a request since she didn't know the names Has the patient contacted their pharmacy? no Preferred Pharmacy (with phone number or street name): [id number] - [place], [place] - [address] AT [facility] [address] [facility] [address] [phone] (Phone) [phone] (Fax)

## 2018-04-29 NOTE — Telephone Encounter (Signed)
Spoke with pt and she stated that she would go [facility] for this medication. She undersood that the medication could not be given without an office visit but stated that since she was in [place] she would be seen there this time.

## 2018-04-29 NOTE — Telephone Encounter (Signed)
I can't fill any of these meds without an office visit

## 2018-06-18 ENCOUNTER — Other Ambulatory Visit: Payer: Self-pay | Admitting: Unknown Physician Specialty

## 2018-06-19 NOTE — Telephone Encounter (Signed)
Called pt to inform her that an appointment would be needed to refill medication. Letter printed.

## 2018-06-19 NOTE — Telephone Encounter (Signed)
Flagyl 500 mg refill request  LOV 10/22/17 an acute visit only with Gabriel Cirri     No future appts noted.  LR:  10/16/17  #14   Refills:  1  [id number] - [address], [place]

## 2018-08-21 ENCOUNTER — Ambulatory Visit (INDEPENDENT_AMBULATORY_CARE_PROVIDER_SITE_OTHER): Payer: Medicaid Other | Admitting: Family Medicine

## 2018-08-21 ENCOUNTER — Encounter: Payer: Self-pay | Admitting: Family Medicine

## 2018-08-21 VITALS — BP 131/82 | HR 74 | Temp 98.4°F | Ht 64.0 in | Wt 163.1 lb

## 2018-08-21 DIAGNOSIS — F319 Bipolar disorder, unspecified: Secondary | ICD-10-CM | POA: Insufficient documentation

## 2018-08-21 DIAGNOSIS — Z23 Encounter for immunization: Secondary | ICD-10-CM

## 2018-08-21 DIAGNOSIS — J4520 Mild intermittent asthma, uncomplicated: Secondary | ICD-10-CM | POA: Diagnosis not present

## 2018-08-21 DIAGNOSIS — I10 Essential (primary) hypertension: Secondary | ICD-10-CM

## 2018-08-21 DIAGNOSIS — R7301 Impaired fasting glucose: Secondary | ICD-10-CM

## 2018-08-21 DIAGNOSIS — F1010 Alcohol abuse, uncomplicated: Secondary | ICD-10-CM

## 2018-08-21 DIAGNOSIS — J45909 Unspecified asthma, uncomplicated: Secondary | ICD-10-CM | POA: Insufficient documentation

## 2018-08-21 DIAGNOSIS — M069 Rheumatoid arthritis, unspecified: Secondary | ICD-10-CM | POA: Insufficient documentation

## 2018-08-21 DIAGNOSIS — Z13220 Encounter for screening for lipoid disorders: Secondary | ICD-10-CM

## 2018-08-21 DIAGNOSIS — R87810 Cervical high risk human papillomavirus (HPV) DNA test positive: Secondary | ICD-10-CM

## 2018-08-21 DIAGNOSIS — R87619 Unspecified abnormal cytological findings in specimens from cervix uteri: Secondary | ICD-10-CM

## 2018-08-21 DIAGNOSIS — E559 Vitamin D deficiency, unspecified: Secondary | ICD-10-CM | POA: Insufficient documentation

## 2018-08-21 DIAGNOSIS — K760 Fatty (change of) liver, not elsewhere classified: Secondary | ICD-10-CM

## 2018-08-21 HISTORY — DX: Cervical high risk human papillomavirus (HPV) DNA test positive: R87.810

## 2018-08-21 HISTORY — DX: Unspecified abnormal cytological findings in specimens from cervix uteri: R87.619

## 2018-08-21 LAB — UA/M W/RFLX CULTURE, ROUTINE
Bilirubin, UA: NEGATIVE
Glucose, UA: NEGATIVE
Ketones, UA: NEGATIVE
Leukocytes, UA: NEGATIVE
Nitrite, UA: NEGATIVE
PH UA: 6.5 (ref 5.0–7.5)
Protein, UA: NEGATIVE
RBC, UA: NEGATIVE
Specific Gravity, UA: 1.02 (ref 1.005–1.030)
UUROB: 0.2 mg/dL (ref 0.2–1.0)

## 2018-08-21 MED ORDER — ALBUTEROL SULFATE HFA 108 (90 BASE) MCG/ACT IN AERS: 2.0000 | INHALATION_SPRAY | Freq: Four times a day (QID) | RESPIRATORY_TRACT | 0 refills | Status: DC | PRN

## 2018-08-21 MED ORDER — AMLODIPINE BESYLATE 2.5 MG PO TABS: 2.5000 mg | ORAL_TABLET | Freq: Every day | ORAL | 3 refills | Status: DC

## 2018-08-21 MED ORDER — HYDROCHLOROTHIAZIDE 25 MG PO TABS: 25.0000 mg | ORAL_TABLET | Freq: Every day | ORAL | 3 refills | Status: DC

## 2018-08-21 MED ORDER — CETIRIZINE HCL 10 MG PO TABS: 10.0000 mg | ORAL_TABLET | Freq: Every day | ORAL | 11 refills | Status: DC

## 2018-08-21 NOTE — Assessment & Plan Note (Signed)
Found on review of previous chart. Not discussed today. Checking labs. Await results- will discuss further at follow up. Had referral to rheumatology, but does not appear that she has seen them.

## 2018-08-21 NOTE — Patient Instructions (Addendum)
Influenza (Flu) Vaccine (Inactivated or Recombinant): What You Need to Know 1. Why get vaccinated? Influenza ("flu") is a contagious disease that spreads around the [place] every year, usually between October and May. Flu is caused by influenza viruses, and is spread mainly by coughing, sneezing, and close contact. Anyone can get flu. Flu strikes suddenly and can last several days. Symptoms vary by age, but can include:  fever/chills  sore throat  muscle aches  fatigue  cough  headache  runny or stuffy nose  Flu can also lead to pneumonia and blood infections, and cause diarrhea and seizures in children. If you have a medical condition, such as heart or lung disease, flu can make it worse. Flu is more dangerous for some people. Infants and young children, people 65 years of age and older, pregnant women, and people with certain health conditions or a weakened immune system are at greatest risk. Each year thousands of people in the [place] die from flu, and many more are hospitalized. Flu vaccine can:  keep you from getting flu,  make flu less severe if you do get it, and  keep you from spreading flu to your family and other people. 2. Inactivated and recombinant flu vaccines A dose of flu vaccine is recommended every flu season. Children 6 months through 8 years of age may need two doses during the same flu season. Everyone else needs only one dose each flu season. Some inactivated flu vaccines contain a very small amount of a mercury-based preservative called thimerosal. Studies have not shown thimerosal in vaccines to be harmful, but flu vaccines that do not contain thimerosal are available. There is no live flu virus in flu shots. They cannot cause the flu. There are many flu viruses, and they are always changing. Each year a new flu vaccine is made to protect against three or four viruses that are likely to cause disease in the upcoming flu season. But even when the  vaccine doesn't exactly match these viruses, it may still provide some protection. Flu vaccine cannot prevent:  flu that is caused by a virus not covered by the vaccine, or  illnesses that look like flu but are not.  It takes about 2 weeks for protection to develop after vaccination, and protection lasts through the flu season. 3. Some people should not get this vaccine Tell the person who is giving you the vaccine:  If you have any severe, life-threatening allergies. If you ever had a life-threatening allergic reaction after a dose of flu vaccine, or have a severe allergy to any part of this vaccine, you may be advised not to get vaccinated. Most, but not all, types of flu vaccine contain a small amount of egg protein.  If you ever had Guillain-Barr Syndrome (also called GBS). Some people with a history of GBS should not get this vaccine. This should be discussed with your doctor.  If you are not feeling well. It is usually okay to get flu vaccine when you have a mild illness, but you might be asked to come back when you feel better.  4. Risks of a vaccine reaction With any medicine, including vaccines, there is a chance of reactions. These are usually mild and go away on their own, but serious reactions are also possible. Most people who get a flu shot do not have any problems with it. Minor problems following a flu shot include:  soreness, redness, or swelling where the shot was given  hoarseness  sore,   red or itchy eyes  cough  fever  aches  headache  itching  fatigue  If these problems occur, they usually begin soon after the shot and last 1 or 2 days. More serious problems following a flu shot can include the following:  There may be a small increased risk of Guillain-Barre Syndrome (GBS) after inactivated flu vaccine. This risk has been estimated at 1 or 2 additional cases per million people vaccinated. This is much lower than the risk of severe complications from  flu, which can be prevented by flu vaccine.  Young children who get the flu shot along with pneumococcal vaccine (PCV13) and/or DTaP vaccine at the same time might be slightly more likely to have a seizure caused by fever. Ask your doctor for more information. Tell your doctor if a child who is getting flu vaccine has ever had a seizure.  Problems that could happen after any injected vaccine:  People sometimes faint after a medical procedure, including vaccination. Sitting or lying down for about 15 minutes can help prevent fainting, and injuries caused by a fall. Tell your doctor if you feel dizzy, or have vision changes or ringing in the ears.  Some people get severe pain in the shoulder and have difficulty moving the arm where a shot was given. This happens very rarely.  Any medication can cause a severe allergic reaction. Such reactions from a vaccine are very rare, estimated at about 1 in a million doses, and would happen within a few minutes to a few hours after the vaccination. As with any medicine, there is a very remote chance of a vaccine causing a serious injury or death. The safety of vaccines is always being monitored. For more information, visit: http://www.aguilar.org/ 5. What if there is a serious reaction? What should I look for? Look for anything that concerns you, such as signs of a severe allergic reaction, very high fever, or unusual behavior. Signs of a severe allergic reaction can include hives, swelling of the face and throat, difficulty breathing, a fast heartbeat, dizziness, and weakness. These would start a few minutes to a few hours after the vaccination. What should I do?  If you think it is a severe allergic reaction or other emergency that can't wait, call 9-1-1 and get the person to the nearest hospital. Otherwise, call your doctor.  Reactions should be reported to the Vaccine Adverse Event Reporting System (VAERS). Your doctor should file this report, or you  can do it yourself through the VAERS web site at www.vaers.SamedayNews.es, or by calling 6094730752. ? VAERS does not give medical advice. 6. The National Vaccine Injury Compensation Program The [employer] Vaccine Injury Compensation Program (VICP) is a federal program that was created to compensate people who may have been injured by certain vaccines. Persons who believe they may have been injured by a vaccine can learn about the program and about filing a claim by calling 458-267-6070 or visiting the [employer] website at GoldCloset.com.ee. There is a time limit to file a claim for compensation. 7. How can I learn more?  Ask your healthcare provider. He or she can give you the vaccine package insert or suggest other sources of information.  Call your local or state health department.  Contact the Centers for Disease Control and Prevention (CDC): ? Call (540)164-9661 (1-800-CDC-INFO) or ? Visit CDC's website at https://gibson.com/ Vaccine Information Statement, Inactivated Influenza Vaccine (06/25/2014) This information is not intended to replace advice given to you by your health care provider. Make sure  you discuss any questions you have with your health care provider. Document Released: 08/30/2006 Document Revised: 07/26/2016 Document Reviewed: 07/26/2016 Elsevier Interactive Patient Education  2017 Else[employer] Inc.  Genital Herpes Genital herpes is a common sexually transmitted infection (STI) that is caused by a virus. The virus spreads from person to person through sexual contact. Infection can cause itching, blisters, and sores around the genitals or rectum. Symptoms may last several days and then go away This is called an outbreak. However, the virus remains in your body, so you may have more outbreaks in the future. The time between outbreaks varies and can be months or years. Genital herpes affects men and women. It is particularly concerning for pregnant women because the virus can  be passed to the baby during delivery and can cause serious problems. Genital herpes is also a concern for people who have a weak disease-fighting (immune) system. What are the causes? This condition is caused by the herpes simplex virus (HSV) type 1 or type 2. The virus may spread through:  Sexual contact with an infected person, including vaginal, anal, and oral sex.  Contact with fluid from a herpes sore.  The skin. This means that you can get herpes from an infected partner even if he or she does not have a visible sore or does not know that he or she is infected.  What increases the risk? You are more likely to develop this condition if:  You have sex with many partners.  You do not use latex condoms during sex.  What are the signs or symptoms? Most people do not have symptoms (asymptomatic) or have mild symptoms that may be mistaken for other skin problems. Symptoms may include:  Small red bumps near the genitals, rectum, or mouth. These bumps turn into blisters and then turn into sores.  Flu-like symptoms, including: ? Fever. ? Body aches. ? Swollen lymph nodes. ? Headache.  Painful urination.  Pain and itching in the genital area or rectal area.  Vaginal discharge.  Tingling or shooting pain in the legs and buttocks.  Generally, symptoms are more severe and last longer during the first (primary) outbreak. Flu-like symptoms are also more common during the primary outbreak. How is this diagnosed? Genital herpes may be diagnosed based on:  A physical exam.  Your medical history.  Blood tests.  A test of a fluid sample (culture) from an open sore.  How is this treated? There is no cure for this condition, but treatment with antiviral medicines that are taken by mouth (orally) can do the following:  Speed up healing and relieve symptoms.  Help to reduce the spread of the virus to sexual partners.  Limit the chance of future outbreaks, or make future outbreaks  shorter.  Lessen symptoms of future outbreaks.  Your health care provider may also recommend pain relief medicines, such as aspirin or ibuprofen. Follow these instructions at home: Sexual activity  Do not have sexual contact during active outbreaks.  Practice safe sex. Latex condoms and female condoms may help prevent the spread of the herpes virus. General instructions  Keep the affected areas dry and clean.  Take over-the-counter and prescription medicines only as told by your health care provider.  Avoid rubbing or touching blisters and sores. If you do touch blisters or sores: ? Wash your hands thoroughly with soap and water. ? Do not touch your eyes afterward.  To help relieve pain or itching, you may take the following actions as directed by your  health care provider: ? Apply a cold, wet cloth (cold compress) to affected areas 4-6 times a day. ? Apply a substance that protects your skin and reduces bleeding (astringent). ? Apply a gel that helps relieve pain around sores (lidocaine gel). ? Take a warm, shallow bath that cleans the genital area (sitz bath).  Keep all follow-up visits as told by your health care provider. This is important. How is this prevented?  Use condoms. Although anyone can get genital herpes during sexual contact, even with the use of a condom, a condom can provide some protection.  Avoid having multiple sexual partners.  Talk with your sexual partner about any symptoms either of you may have. Also, talk with your partner about any history of STIs.  Get tested for STIs before you have sex. Ask your partner to do the same.  Do not have sexual contact if you have symptoms of genital herpes. Contact a health care provider if:  Your symptoms are not improving with medicine.  Your symptoms return.  You have new symptoms.  You have a fever.  You have abdominal pain.  You have redness, swelling, or pain in your eye.  You notice new sores on  other parts of your body.  You are a woman and experience bleeding between menstrual periods.  You have had herpes and you become pregnant or plan to become pregnant. Summary  Genital herpes is a common sexually transmitted infection (STI) that is caused by the herpes simplex virus (HSV) type 1 or type 2.  These viruses are most often spread through sexual contact with an infected person.  You are more likely to develop this condition if you have sex with many partners or you have unprotected sex.  Most people do not have symptoms (asymptomatic) or have mild symptoms that may be mistaken for other skin problems. Symptoms occur as outbreaks that may happen months or years apart.  There is no cure for this condition, but treatment with oral antiviral medicines can reduce symptoms, reduce the chance of spreading the virus to a partner, prevent future outbreaks, or shorten future outbreaks. This information is not intended to replace advice given to you by your health care provider. Make sure you discuss any questions you have with your health care provider. Document Released: 11/02/2000 Document Revised: 10/05/2016 Document Reviewed: 10/05/2016 Elsevier Interactive Patient Education  2018 [employer]. Valacyclovir caplets What is this medicine? VALACYCLOVIR (val ay SYE kloe veer) is an antiviral medicine. It is used to treat or prevent infections caused by certain kinds of viruses. Examples of these infections include herpes and shingles. This medicine will not cure herpes. This medicine may be used for other purposes; ask your health care provider or pharmacist if you have questions. COMMON BRAND NAME(S): Valtrex What should I tell my health care provider before I take this medicine? They need to know if you have any of these conditions: -acquired immunodeficiency syndrome (AIDS) -any other condition that may weaken the immune system -bone marrow or kidney transplant -kidney disease -an  unusual or allergic reaction to valacyclovir, acyclovir, ganciclovir, valganciclovir, other medicines, foods, dyes, or preservatives -pregnant or trying to get pregnant -breast-feeding How should I use this medicine? Take this medicine by mouth with a glass of water. Follow the directions on the prescription label. You can take this medicine with or without food. Take your doses at regular intervals. Do not take your medicine more often than directed. Finish the full course prescribed by your doctor or  health care professional even if you think your condition is better. Do not stop taking except on the advice of your doctor or health care professional. Talk to your pediatrician regarding the use of this medicine in children. While this drug may be prescribed for children as young as 2 years for selected conditions, precautions do apply. Overdosage: If you think you have taken too much of this medicine contact a poison control center or emergency room at once. NOTE: This medicine is only for you. Do not share this medicine with others. What if I miss a dose? If you miss a dose, take it as soon as you can. If it is almost time for your next dose, take only that dose. Do not take double or extra doses. What may interact with this medicine? -cimetidine -probenecid This list may not describe all possible interactions. Give your health care provider a list of all the medicines, herbs, non-prescription drugs, or dietary supplements you use. Also tell them if you smoke, drink alcohol, or use illegal drugs. Some items may interact with your medicine. What should I watch for while using this medicine? Tell your doctor or health care professional if your symptoms do not start to get better after 1 week. This medicine works best when taken early in the course of an infection, within the first 72 hours. Begin treatment as soon as possible after the first signs of infection like tingling, itching, or pain in the  affected area. It is possible that genital herpes may still be spread even when you are not having symptoms. Always use safer sex practices like condoms made of latex or polyurethane whenever you have sexual contact. You should stay well hydrated while taking this medicine. Drink plenty of fluids. What side effects may I notice from receiving this medicine? Side effects that you should report to your doctor or health care professional as soon as possible: -allergic reactions like skin rash, itching or hives, swelling of the face, lips, or tongue -aggressive behavior -confusion -hallucinations -problems with balance, talking, walking -stomach pain -tremor -trouble passing urine or change in the amount of urine Side effects that usually do not require medical attention (report to your doctor or health care professional if they continue or are bothersome): -dizziness -headache -nausea, vomiting This list may not describe all possible side effects. Call your doctor for medical advice about side effects. You may report side effects to FDA at 1-800-FDA-1088. Where should I keep my medicine? Keep out of the reach of children. Store at room temperature between 15 and 25 degrees C (59 and 77 degrees F). Keep container tightly closed. Throw away any unused medicine after the expiration date. NOTE: This sheet is a summary. It may not cover all possible information. If you have questions about this medicine, talk to your doctor, pharmacist, or health care provider.  2018 Elsevier/Gold Standard (2012-10-21 16:34:05)

## 2018-08-21 NOTE — Assessment & Plan Note (Addendum)
Under good control today not on medicine. 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 Will stop HCTZ and start 2.5mg  amlodipine. Recheck 1 month with BMP- if BP running low, will stop.

## 2018-08-21 NOTE — Assessment & Plan Note (Signed)
Found on review of previous chart. Not discussed today. Checking labs. Await results- will discuss further at follow up.  

## 2018-08-21 NOTE — Assessment & Plan Note (Signed)
Will do spiro next visit. Will start albuterol for now to be used PRN. Call with any concerns.

## 2018-08-21 NOTE — Assessment & Plan Note (Addendum)
Found on review of previous chart. Not discussed today. Hysterectomy in 2016 for benign reasons. Adding to history.

## 2018-08-21 NOTE — Progress Notes (Signed)
BP 131/82   Pulse 74   Temp 98.4 F (36.9 C) (Oral)   Ht 5\' 4"  (1.626 m)   Wt 163 lb 1.6 oz (74 kg)   LMP  (LMP Unknown)   SpO2 98%   BMI 28.00 kg/m    Subjective:    Patient ID: Amy West, female    DOB: Oct 22, 1971, 47 y.o.   MRN: [id number]  HPI: Amy West is a 47 y.o. female who has been lost to follow up since December of last year.   Chief Complaint  Patient presents with  . Labs Only    pt states she thinks she needs lab work  . 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 Asthma    pt states she would like an inhaler as her asthma is worse this time of year  . Allergies    pt states she would like a RX for zyrtec for allergies   HYPERTENSION- has been on HCTZ, has been taking it most days- did not take it today. Has also been on amlodipine in the past Hypertension status: stable  Satisfied with current treatment? yes Duration of hypertension: chronic BP monitoring frequency:  not checking BP medication side effects:  no Medication compliance: poor compliance Previous BP meds:HCTZ and amlodipine Aspirin: no Recurrent headaches: no Visual changes: no Palpitations: no Dyspnea: no Chest pain: no Lower extremity edema: no Dizzy/lightheaded: no  Was concerned about her herpes. Does not feel like it was adequately explained to her.   ASTHMA- has more problems with asthma at  Asthma status: stable Satisfied with current treatment?: yes Albuterol/rescue inhaler frequency: rarely with change in season Dyspnea frequency:  rarely with change in season Wheezing frequency: rarely with change in season Cough frequency:  rarely with change in season Nocturnal symptom frequency:  rarely with change in season Limitation of activity: no Current upper respiratory symptoms: no Triggers: change in season Home peak flows: No Last Spirometry: Never Asthma meds in past: Albuterol Aerochamber/spacer use: no Visits to ER or Urgent Care in past  year: no Pneumovax: Up to Date Influenza: Up to Date  Relevant past medical, surgical, family and social history reviewed and updated as indicated. Interim medical history since our last visit reviewed. Allergies and medications reviewed and updated.  Review of Systems  Constitutional: Negative.   Respiratory: Negative.   Cardiovascular: Negative.   Neurological: Negative.   Psychiatric/Behavioral: Negative.     Per HPI unless specifically indicated above     Objective:    BP 131/82   Pulse 74   Temp 98.4 F (36.9 C) (Oral)   Ht 5\' 4"  (1.626 m)   Wt 163 lb 1.6 oz (74 kg)   LMP  (LMP Unknown)   SpO2 98%   BMI 28.00 kg/m   Wt Readings from Last 3 Encounters:  08/21/18 163 lb 1.6 oz (74 kg)  03/02/18 160 lb (72.6 kg)  10/22/17 166 lb 6.4 oz (75.5 kg)    Physical Exam  Constitutional: She is oriented to person, place, and time. She appears well-developed and well-nourished. No distress.  HENT:  Head: Normocephalic and atraumatic.  Right Ear: Hearing normal.  Left Ear: Hearing normal.  Nose: Nose normal.  Eyes: Conjunctivae and lids are normal. Right eye exhibits no discharge. Left eye exhibits no discharge. No scleral icterus.  Cardiovascular: Normal rate, regular rhythm, normal heart sounds and intact distal pulses. Exam reveals no gallop and no friction rub.  No murmur heard. Pulmonary/Chest: Effort normal and breath sounds normal. No stridor. No respiratory distress. She has no wheezes. She has no rales. She exhibits no tenderness.  Musculoskeletal: Normal range of motion.  Neurological: She is alert and oriented to person, place, and time.  Skin: Skin is warm, dry and intact. Capillary refill takes less than 2 seconds. No rash noted. She is not diaphoretic. No erythema. No pallor.  Psychiatric: She has a normal mood and affect. Her speech is normal and behavior is normal. Judgment and thought content normal. Cognition and memory are normal.  Nursing note and vitals  reviewed.   Results for orders placed or performed in visit on 10/22/17  HIV antibody  Result Value Ref Range   HIV Screen 4th Generation wRfx Non Reactive Non Reactive  HSV(herpes simplex vrs) 1+2 ab-IgG  Result Value Ref Range   HSV 1 Glycoprotein G Ab, IgG 23.20 (H) 0.00 - 0.90 index   HSV 2 IgG, Type Spec 9.16 (H) 0.00 - 0.90 index  RPR  Result Value Ref Range   RPR Ser Ql Non Reactive Non Reactive  Lyme Ab/Western Blot Reflex  Result Value Ref Range   Lyme IgG/IgM Ab <0.91 0.00 - 0.90 ISR   LYME DISEASE AB, QUANT, IGM <0.80 0.00 - 0.79 index      Assessment & Plan:   Problem List Items Addressed This Visit      Cardiovascular and Mediastinum   Essential hypertension - Primary    Under good control today not on medicine. 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 Will stop HCTZ and start 2.5mg  amlodipine. Recheck 1 month with BMP- if BP running low, will stop.       Relevant Medications   hydrochlorothiazide (HYDRODIURIL) 25 MG tablet   amLODipine (NORVASC) 2.5 MG tablet   Other Relevant Orders   CBC with Differential/Platelet   Comprehensive metabolic panel   TSH   UA/M w/rflx Culture, Routine     Respiratory   Asthma    Will do spiro next visit. Will start albuterol for now to be used PRN. Call with any concerns.       Relevant Medications   albuterol (PROVENTIL HFA;VENTOLIN HFA) 108 (90 Base) MCG/ACT inhaler     Digestive   Hepatic steatosis    Found on review of previous chart. Not discussed today. Checking labs. Await results- will discuss further at follow up.         Endocrine   IFG (impaired fasting glucose)    Found on review of previous chart. Not discussed today. Checking labs. Await results- will discuss further at follow up.         Other   Bipolar 1 disorder, depressed (HCC)    Found on review of previous chart. Not discussed today.  Checking labs. Await results- will discuss further at follow up.       Alcohol abuse    Found on review of previous chart. Not discussed today. Checking labs. Await results- will discuss further at follow up.       Vitamin D deficiency    Found on review of previous chart. Not discussed today. Checking labs. Await results- will discuss further at follow up.       RESOLVED: Cervical high risk HPV (human papillomavirus) test positive    Found on review of previous chart. Not discussed today. Hysterectomy in 2016 for benign reasons. Adding  to history.       RESOLVED: Abnormal Pap smear of cervix    Found on review of previous chart. Not discussed today. Hysterectomy in 2016 for benign reasons. Adding to history.        Other Visit Diagnoses    Need for influenza vaccination       Flu shot given today   Relevant Orders   Flu Vaccine QUAD 36+ mos IM (Completed)   Screening for cholesterol level       Labs checked today. Await results.    Relevant Orders   Lipid Panel w/o Chol/HDL Ratio       Follow up plan: Return in about 4 weeks (around 09/18/2018) for Physical.

## 2018-08-21 NOTE — Assessment & Plan Note (Signed)
Found on review of previous chart. Not discussed today. Checking labs. Await results- will discuss further at follow up.

## 2018-08-21 NOTE — Assessment & Plan Note (Addendum)
Found on review of previous chart. Not discussed today. Hysterectomy in 2016 for benign reasons. Adding to history.  

## 2018-08-22 LAB — COMPREHENSIVE METABOLIC PANEL
A/G RATIO: 1.8 (ref 1.2–2.2)
ALBUMIN: 4.8 g/dL (ref 3.5–5.5)
ALT: 9 IU/L (ref 0–32)
AST: 16 IU/L (ref 0–40)
Alkaline Phosphatase: 89 IU/L (ref 39–117)
BUN/Creatinine Ratio: 22 (ref 9–23)
BUN: 13 mg/dL (ref 6–24)
Bilirubin Total: 0.3 mg/dL (ref 0.0–1.2)
CALCIUM: 10 mg/dL (ref 8.7–10.2)
CO2: 26 mmol/L (ref 20–29)
Chloride: 100 mmol/L (ref 96–106)
Creatinine, Ser: 0.59 mg/dL (ref 0.57–1.00)
GFR calc Af Amer: 126 mL/min/{1.73_m2} (ref >59)
GFR, EST NON AFRICAN AMERICAN: 109 mL/min/{1.73_m2} (ref >59)
GLOBULIN, TOTAL: 2.6 g/dL (ref 1.5–4.5)
Glucose: 83 mg/dL (ref 65–99)
POTASSIUM: 4.4 mmol/L (ref 3.5–5.2)
Sodium: 141 mmol/L (ref 134–144)
Total Protein: 7.4 g/dL (ref 6.0–8.5)

## 2018-08-22 LAB — CBC WITH DIFFERENTIAL/PLATELET
BASOS: 0 %
Basophils Absolute: 0 10*3/uL (ref 0.0–0.2)
EOS (ABSOLUTE): 0.3 10*3/uL (ref 0.0–0.4)
EOS: 3 %
HEMATOCRIT: 39.5 % (ref 34.0–46.6)
HEMOGLOBIN: 13.1 g/dL (ref 11.1–15.9)
IMMATURE GRANS (ABS): 0 10*3/uL (ref 0.0–0.1)
IMMATURE GRANULOCYTES: 0 %
Lymphocytes Absolute: 3.2 10*3/uL — ABNORMAL HIGH (ref 0.7–3.1)
Lymphs: 34 %
MCH: 28 pg (ref 26.6–33.0)
MCHC: 33.2 g/dL (ref 31.5–35.7)
MCV: 84 fL (ref 79–97)
Monocytes Absolute: 0.8 10*3/uL (ref 0.1–0.9)
Monocytes: 9 %
NEUTROS ABS: 5.2 10*3/uL (ref 1.4–7.0)
NEUTROS PCT: 54 %
Platelets: 396 10*3/uL (ref 150–450)
RBC: 4.68 x10E6/uL (ref 3.77–5.28)
RDW: 16.2 % — ABNORMAL HIGH (ref 12.3–15.4)
WBC: 9.4 10*3/uL (ref 3.4–10.8)

## 2018-08-22 LAB — LIPID PANEL W/O CHOL/HDL RATIO
Cholesterol, Total: 203 mg/dL — ABNORMAL HIGH (ref 100–199)
HDL: 50 mg/dL (ref >39)
LDL Calculated: 135 mg/dL — ABNORMAL HIGH (ref 0–99)
Triglycerides: 91 mg/dL (ref 0–149)
VLDL Cholesterol Cal: 18 mg/dL (ref 5–40)

## 2018-08-22 LAB — TSH: TSH: 0.552 u[IU]/mL (ref 0.450–4.500)

## 2018-08-26 ENCOUNTER — Encounter: Payer: Self-pay | Admitting: Family Medicine

## 2018-09-09 ENCOUNTER — Telehealth: Payer: Self-pay | Admitting: Family Medicine

## 2018-09-09 NOTE — Telephone Encounter (Signed)
Message from result letter relayed to patient. Verbalized understanding and denied questions.

## 2018-09-09 NOTE — Telephone Encounter (Signed)
Copied from CRM [id number]. Topic: Quick Communication - Lab Results (Clinic Use ONLY) >> Sep 09, 2018 11:04 AM Stephannie Li, NT wrote: Patient called and would like lab results from 08/21/18 please call her at  [phone]

## 2018-09-18 ENCOUNTER — Other Ambulatory Visit: Payer: Self-pay | Admitting: Family Medicine

## 2018-09-18 ENCOUNTER — Other Ambulatory Visit: Payer: Self-pay | Admitting: Unknown Physician Specialty

## 2018-09-18 NOTE — Telephone Encounter (Signed)
Verified with pharmacy, no refills available

## 2019-02-18 ENCOUNTER — Other Ambulatory Visit: Payer: Self-pay | Admitting: Unknown Physician Specialty

## 2019-02-18 NOTE — Telephone Encounter (Signed)
Requested Prescriptions  Pending Prescriptions Disp Refills  . valACYclovir (VALTREX) 500 MG tablet [Pharmacy Med Name: VALACYCLOVIR 500MG  TABLET] 90 tablet 1    Sig: TAKE 1 TABLET(500 MG) BY MOUTH DAILY     Antimicrobials:  Antiviral Agents - Anti-Herpetic Passed - 02/18/2019  3:40 PM      Passed - Valid encounter within last 12 months    Recent Outpatient Visits          6 months ago Essential hypertension   [facility] Gray, Megan P, DO   1 year ago Labial lesion   [facility] Gabriel Cirri, NP   1 year ago Chronic pain syndrome   [facility] Gabriel Cirri, NP

## 2019-03-06 ENCOUNTER — Ambulatory Visit (INDEPENDENT_AMBULATORY_CARE_PROVIDER_SITE_OTHER): Payer: Medicaid Other | Admitting: Family Medicine

## 2019-03-06 ENCOUNTER — Encounter: Payer: Self-pay | Admitting: Family Medicine

## 2019-03-06 ENCOUNTER — Telehealth: Payer: Self-pay | Admitting: Unknown Physician Specialty

## 2019-03-06 ENCOUNTER — Other Ambulatory Visit: Payer: Self-pay

## 2019-03-06 VITALS — Ht 63.0 in | Wt 165.0 lb

## 2019-03-06 DIAGNOSIS — N760 Acute vaginitis: Secondary | ICD-10-CM | POA: Diagnosis not present

## 2019-03-06 DIAGNOSIS — B9689 Other specified bacterial agents as the cause of diseases classified elsewhere: Secondary | ICD-10-CM

## 2019-03-06 MED ORDER — METRONIDAZOLE 500 MG PO TABS: 500.0000 mg | ORAL_TABLET | Freq: Two times a day (BID) | ORAL | 0 refills | Status: DC

## 2019-03-06 NOTE — Telephone Encounter (Signed)
appt scheduled

## 2019-03-06 NOTE — Telephone Encounter (Signed)
Needs virtual visit please, today if possible. Thanks!

## 2019-03-06 NOTE — Progress Notes (Signed)
Ht 5\' 3"  (1.6 m)   Wt 165 lb (74.8 kg)   LMP  (LMP Unknown)   BMI 29.23 kg/m    Subjective:    Patient ID: Amy West, female    DOB: [birth year], 48 y.o.   MRN: [id number]  HPI: Amy West is a 48 y.o. female  Chief Complaint  Patient presents with  . Vaginal Discharge    white discharge for about a week.   VAGINAL DISCHARGE Duration: 1 week Discharge description: yellow  Pruritus: no Dysuria: yes Malodorous: yes Urinary frequency: yes Fevers: no Abdominal pain: no  Sexual activity: monogamous History of sexually transmitted diseases: no Recent antibiotic use: no Context: recurrent BV  Treatments attempted: none  Relevant past medical, surgical, family and social history reviewed and updated as indicated. Interim medical history since our last visit reviewed. Allergies and medications reviewed and updated.  Review of Systems  Constitutional: Negative.   Respiratory: Negative.   Cardiovascular: Negative.   Genitourinary: Positive for vaginal discharge. Negative for decreased urine volume, difficulty urinating, dyspareunia, dysuria, enuresis, flank pain, frequency, genital sores, hematuria, menstrual problem, pelvic pain, urgency, vaginal bleeding and vaginal pain.  Musculoskeletal: Negative.   Psychiatric/Behavioral: Negative.     Per HPI unless specifically indicated above     Objective:    Ht 5\' 3"  (1.6 m)   Wt 165 lb (74.8 kg)   LMP  (LMP Unknown)   BMI 29.23 kg/m   Wt Readings from Last 3 Encounters:  03/06/19 165 lb (74.8 kg)  08/21/18 163 lb 1.6 oz (74 kg)  03/02/18 160 lb (72.6 kg)    Physical Exam Vitals signs and nursing note reviewed.  Constitutional:      General: She is not in acute distress.    Appearance: Normal appearance. She is not ill-appearing, toxic-appearing or diaphoretic.  HENT:     Head: Normocephalic and atraumatic.     Right Ear: External ear normal.     Left Ear: External ear normal.     Nose: Nose normal.   Mouth/Throat:     Mouth: Mucous membranes are moist.     Pharynx: Oropharynx is clear.  Eyes:     General: No scleral icterus.       Right eye: No discharge.        Left eye: No discharge.     Conjunctiva/sclera: Conjunctivae normal.     Pupils: Pupils are equal, round, and reactive to light.  Neck:     Musculoskeletal: Normal range of motion.  Pulmonary:     Effort: Pulmonary effort is normal. No respiratory distress.     Comments: Speaking in full sentences Musculoskeletal: Normal range of motion.  Skin:    Coloration: Skin is not jaundiced or pale.     Findings: No bruising, erythema, lesion or rash.  Neurological:     Mental Status: She is alert and oriented to person, place, and time. Mental status is at baseline.  Psychiatric:        Mood and Affect: Mood normal.        Behavior: Behavior normal.        Thought Content: Thought content normal.        Judgment: Judgment normal.     Results for orders placed or performed in visit on 08/21/18  CBC with Differential/Platelet  Result Value Ref Range   WBC 9.4 3.4 - 10.8 x10E3/uL   RBC 4.68 3.77 - 5.28 x10E6/uL   Hemoglobin 13.1 11.1 - 15.9 g/dL   Hematocrit 39.5  34.0 - 46.6 %   MCV 84 79 - 97 fL   MCH 28.0 26.6 - 33.0 pg   MCHC 33.2 31.5 - 35.7 g/dL   RDW 23.5 (H) 57.3 - 22.0 %   Platelets 396 150 - 450 x10E3/uL   Neutrophils 54 Not Estab. %   Lymphs 34 Not Estab. %   Monocytes 9 Not Estab. %   Eos 3 Not Estab. %   Basos 0 Not Estab. %   Neutrophils Absolute 5.2 1.4 - 7.0 x10E3/uL   Lymphocytes Absolute 3.2 (H) 0.7 - 3.1 x10E3/uL   Monocytes Absolute 0.8 0.1 - 0.9 x10E3/uL   EOS (ABSOLUTE) 0.3 0.0 - 0.4 x10E3/uL   Basophils Absolute 0.0 0.0 - 0.2 x10E3/uL   Immature Granulocytes 0 Not Estab. %   Immature Grans (Abs) 0.0 0.0 - 0.1 x10E3/uL  Comprehensive metabolic panel  Result Value Ref Range   Glucose 83 65 - 99 mg/dL   BUN 13 6 - 24 mg/dL   Creatinine, Ser 2.54 0.57 - 1.00 mg/dL   GFR calc non Af Amer 109  >59 mL/min/1.73   GFR calc Af Amer 126 >59 mL/min/1.73   BUN/Creatinine Ratio 22 9 - 23   Sodium 141 134 - 144 mmol/L   Potassium 4.4 3.5 - 5.2 mmol/L   Chloride 100 96 - 106 mmol/L   CO2 26 20 - 29 mmol/L   Calcium 10.0 8.7 - 10.2 mg/dL   Total Protein 7.4 6.0 - 8.5 g/dL   Albumin 4.8 3.5 - 5.5 g/dL   Globulin, Total 2.6 1.5 - 4.5 g/dL   Albumin/Globulin Ratio 1.8 1.2 - 2.2   Bilirubin Total 0.3 0.0 - 1.2 mg/dL   Alkaline Phosphatase 89 39 - 117 IU/L   AST 16 0 - 40 IU/L   ALT 9 0 - 32 IU/L  Lipid Panel w/o Chol/HDL Ratio  Result Value Ref Range   Cholesterol, Total 203 (H) 100 - 199 mg/dL   Triglycerides 91 0 - 149 mg/dL   HDL 50 >27 mg/dL   VLDL Cholesterol Cal 18 5 - 40 mg/dL   LDL Calculated 062 (H) 0 - 99 mg/dL  TSH  Result Value Ref Range   TSH 0.552 0.450 - 4.500 uIU/mL  UA/M w/rflx Culture, Routine  Result Value Ref Range   Specific Gravity, UA 1.020 1.005 - 1.030   pH, UA 6.5 5.0 - 7.5   Color, UA Yellow Yellow   Appearance Ur Cloudy (A) Clear   Leukocytes, UA Negative Negative   Protein, UA Negative Negative/Trace   Glucose, UA Negative Negative   Ketones, UA Negative Negative   RBC, UA Negative Negative   Bilirubin, UA Negative Negative   Urobilinogen, Ur 0.2 0.2 - 1.0 mg/dL   Nitrite, UA Negative Negative      Assessment & Plan:   Problem List Items Addressed This Visit    None    Visit Diagnoses    BV (bacterial vaginosis)    -  Primary   Has a history- will treat with flagyl, if not better by middle of next week will need to come in for self-swab.   Relevant Medications   metroNIDAZOLE (FLAGYL) 500 MG tablet       Follow up plan: Return ASAP Follow up BP.    Marland Kitchen This visit was completed via FaceTime due to the restrictions of the COVID-19 pandemic. All issues as above were discussed and addressed. Physical exam was done as above through visual confirmation on FaceTime. If it  was felt that the patient should be evaluated in the office, they were  directed there. The patient verbally consented to this visit. . Location of the patient: home . Location of the provider: work . Those involved with this call:  . Provider: Olevia Perches, DO . CMA: Elton Sin, CMA . Front Desk/Registration: Adela Ports  . Time spent on call: 15 minutes with patient face to face via video conference. More than 50% of this time was spent in counseling and coordination of care. 23 minutes total spent in review of patient's record and preparation of their chart.

## 2019-03-06 NOTE — Telephone Encounter (Signed)
Copied from CRM [id number]. Topic: Quick Communication - Rx Refill/Question >> Mar 06, 2019  2:44 PM Rica Koyanagi, Barbee Cough wrote: Medication: something for BV Has the patient contacted their pharmacy?  (Agent: If no, request that the patient contact the pharmacy for the refill.) (Agent: If yes, when and what did the pharmacy advise?)  Preferred Pharmacy (with phone number or street name): [facility] [address]  Cb is [phone] Pt says she gets bacterial vaginosis all the time and is wanting something called in for it.  She says sh will do appt if needed but would like something right away    Agent: Please be advised that RX refills may take up to 3 business days. We ask that you follow-up with your pharmacy.

## 2019-03-16 ENCOUNTER — Ambulatory Visit: Payer: Medicaid Other | Admitting: Family Medicine

## 2019-03-16 ENCOUNTER — Other Ambulatory Visit: Payer: Self-pay

## 2019-04-08 ENCOUNTER — Telehealth: Payer: Self-pay | Admitting: Family Medicine

## 2019-04-08 NOTE — Telephone Encounter (Signed)
Called to schedule patient BP fu appt scheduled for 5/29 but she is requesting something to be called in for yeast infection.  She would like [facility] sent to [facility] in [place]

## 2019-04-09 NOTE — Telephone Encounter (Signed)
Called pt to let her know that she needs to set up appt to have swab done, no answer, voicemail is full so I was unable to leave a voicemail.

## 2019-04-09 NOTE — Telephone Encounter (Signed)
Needs an appointment for a swab

## 2019-04-16 ENCOUNTER — Telehealth: Payer: Self-pay | Admitting: Family Medicine

## 2019-04-16 NOTE — Telephone Encounter (Signed)
If she is asymptomatic and wears a mask, we are OK

## 2019-04-16 NOTE — Telephone Encounter (Signed)
Called pt to go over Covid-19 screening questions, pt states that she is in constant contact with Covid pts at work. I asked if she was able to get bp to do virtual she stated no. Appt is for bp fu, please advise.

## 2019-04-17 ENCOUNTER — Ambulatory Visit: Payer: Medicaid Other | Admitting: Family Medicine

## 2019-06-05 ENCOUNTER — Telehealth: Payer: Self-pay | Admitting: Nurse Practitioner

## 2019-06-05 ENCOUNTER — Telehealth: Payer: Self-pay | Admitting: Unknown Physician Specialty

## 2019-06-05 ENCOUNTER — Other Ambulatory Visit: Payer: Self-pay | Admitting: Nurse Practitioner

## 2019-06-05 MED ORDER — METRONIDAZOLE 500 MG PO TABS: 500.0000 mg | ORAL_TABLET | Freq: Two times a day (BID) | ORAL | 0 refills | Status: DC

## 2019-06-05 MED ORDER — VALACYCLOVIR HCL 500 MG PO TABS: ORAL_TABLET | ORAL | 1 refills | Status: DC

## 2019-06-05 NOTE — Telephone Encounter (Signed)
Medication Refill - Medication:  metroNIDAZOLE (FLAGYL) 500 MG tablet valACYclovir (VALTREX) 500 MG tablet   Has the patient contacted their pharmacy? Yes advised to call   Preferred Pharmacy (with phone number or street name):  [id number] - [address], [place] [phone] (Phone) [phone] (Fax)   Agent: Please be advised that RX refills may take up to 3 business days. We ask that you follow-up with your pharmacy.

## 2019-06-05 NOTE — Progress Notes (Signed)
Medication refills

## 2019-06-05 NOTE — Telephone Encounter (Signed)
Called pt to let her know that medicine has been sent and to try to schedule appt, no answer, vm full

## 2019-06-05 NOTE — Telephone Encounter (Signed)
Called patient, no answer, unable to leave a message, will try again Monday.

## 2019-06-05 NOTE — Telephone Encounter (Signed)
Copied from [place] [id number]. Topic: Appointment Scheduling - Scheduling Inquiry for Clinic >> Jun 05, 2019 11:20 AM Richardo Priest, NT wrote: Reason for CRM: Patient is calling in stating she would like lab orders to be placed for routine labs. Please advise and call back is [phone].

## 2019-06-05 NOTE — Telephone Encounter (Signed)
Patient has not been seen for office visit since October 2019.  She needs in office chronic disease visit and then can obtain labs.

## 2019-06-05 NOTE — Telephone Encounter (Signed)
Refills sent.  She is due for chronic disease visit in October, can we see if we can get this scheduled.

## 2019-06-08 NOTE — Telephone Encounter (Signed)
Called pt. VM box was full. Unable to leave message.

## 2019-06-09 NOTE — Telephone Encounter (Signed)
Attempted to reach pt. VM box was full and I was unable to leave vm. Will close due to inability to reach pt.

## 2019-08-17 ENCOUNTER — Other Ambulatory Visit: Payer: Self-pay

## 2019-08-17 ENCOUNTER — Ambulatory Visit (INDEPENDENT_AMBULATORY_CARE_PROVIDER_SITE_OTHER): Payer: Medicaid Other | Admitting: Family Medicine

## 2019-08-17 ENCOUNTER — Encounter: Payer: Self-pay | Admitting: Family Medicine

## 2019-08-17 VITALS — BP 160/92 | HR 73 | Temp 98.4°F | Ht 63.0 in

## 2019-08-17 DIAGNOSIS — B373 Candidiasis of vulva and vagina: Secondary | ICD-10-CM

## 2019-08-17 DIAGNOSIS — B9689 Other specified bacterial agents as the cause of diseases classified elsewhere: Secondary | ICD-10-CM | POA: Diagnosis not present

## 2019-08-17 DIAGNOSIS — N390 Urinary tract infection, site not specified: Secondary | ICD-10-CM | POA: Diagnosis not present

## 2019-08-17 DIAGNOSIS — N760 Acute vaginitis: Secondary | ICD-10-CM | POA: Diagnosis not present

## 2019-08-17 DIAGNOSIS — B3731 Acute candidiasis of vulva and vagina: Secondary | ICD-10-CM

## 2019-08-17 LAB — WET PREP FOR TRICH, YEAST, CLUE
Clue Cell Exam: POSITIVE — AB
Trichomonas Exam: NEGATIVE
Yeast Exam: POSITIVE — AB

## 2019-08-17 MED ORDER — FLUCONAZOLE 150 MG PO TABS: 150.0000 mg | ORAL_TABLET | Freq: Once | ORAL | 0 refills | Status: AC

## 2019-08-17 MED ORDER — METRONIDAZOLE 500 MG PO TABS: 500.0000 mg | ORAL_TABLET | Freq: Two times a day (BID) | ORAL | 0 refills | Status: DC

## 2019-08-17 MED ORDER — SULFAMETHOXAZOLE-TRIMETHOPRIM 800-160 MG PO TABS: 1.0000 | ORAL_TABLET | Freq: Two times a day (BID) | ORAL | 0 refills | Status: DC

## 2019-08-17 NOTE — Progress Notes (Signed)
BP (!) 160/92 (BP Location: Left Arm, Patient Position: Sitting, Cuff Size: Normal)   Pulse 73   Temp 98.4 F (36.9 C) (Oral)   Ht 5\' 3"  (1.6 m)   LMP  (LMP Unknown)   BMI 29.23 kg/m    Subjective:    Patient ID: Amy West, female    DOB: 14-Aug-1971, 48 y.o.   MRN: [id number]  HPI: Amy West is a 48 y.o. female  Chief Complaint  Patient presents with  . Dysuria    Ongoing 2 weeks  . Vaginal Itching   Dysuria and spotting with wiping for 2 weeks now. Some abdominal cramping and discharge. Denies fevers, chills, N/V/D, rashes, concern for STIs. Not trying anything OTC for sxs.   Relevant past medical, surgical, family and social history reviewed and updated as indicated. Interim medical history since our last visit reviewed. Allergies and medications reviewed and updated.  Review of Systems  Per HPI unless specifically indicated above     Objective:    BP (!) 160/92 (BP Location: Left Arm, Patient Position: Sitting, Cuff Size: Normal)   Pulse 73   Temp 98.4 F (36.9 C) (Oral)   Ht 5\' 3"  (1.6 m)   LMP  (LMP Unknown)   BMI 29.23 kg/m   Wt Readings from Last 3 Encounters:  03/06/19 165 lb (74.8 kg)  08/21/18 163 lb 1.6 oz (74 kg)  03/02/18 160 lb (72.6 kg)    Physical Exam Vitals signs and nursing note reviewed.  Constitutional:      Appearance: Normal appearance. She is not ill-appearing.  HENT:     Head: Atraumatic.  Eyes:     Extraocular Movements: Extraocular movements intact.     Conjunctiva/sclera: Conjunctivae normal.  Neck:     Musculoskeletal: Normal range of motion and neck supple.  Cardiovascular:     Rate and Rhythm: Normal rate and regular rhythm.     Heart sounds: Normal heart sounds.  Pulmonary:     Effort: Pulmonary effort is normal.     Breath sounds: Normal breath sounds.  Abdominal:     General: Bowel sounds are normal.     Palpations: Abdomen is soft.     Tenderness: There is no abdominal tenderness. There is no right CVA  tenderness, left CVA tenderness or guarding.  Genitourinary:    Vagina: Vaginal discharge present.  Musculoskeletal: Normal range of motion.  Skin:    General: Skin is warm and dry.  Neurological:     Mental Status: She is alert and oriented to person, place, and time.  Psychiatric:        Mood and Affect: Mood normal.        Thought Content: Thought content normal.        Judgment: Judgment normal.     Results for orders placed or performed in visit on 08/17/19  WET PREP FOR [place], YEAST, CLUE   Specimen: Vaginal Fluid   VAGINAL FLUI  Result Value Ref Range   Trichomonas Exam Negative Negative   Yeast Exam Positive (A) Negative   Clue Cell Exam Positive (A) Negative  Microscopic Examination   URINE  Result Value Ref Range   WBC, UA 11-30 (A) 0 - 5 /hpf   RBC 0-2 0 - 2 /hpf   Epithelial Cells (non renal) 0-10 0 - 10 /hpf   Bacteria, UA Few (A) None seen/Few  Urine Culture, Reflex   URINE  Result Value Ref Range   Urine Culture, Routine WILL FOLLOW   UA/M  w/rflx Culture, Routine   Specimen: Urine   URINE  Result Value Ref Range   Specific Gravity, UA 1.020 1.005 - 1.030   pH, UA 6.5 5.0 - 7.5   Color, UA Yellow Yellow   Appearance Ur Hazy (A) Clear   Leukocytes,UA 3+ (A) Negative   Protein,UA 1+ (A) Negative/Trace   Glucose, UA Negative Negative   Ketones, UA Negative Negative   RBC, UA Trace (A) Negative   Bilirubin, UA Negative Negative   Urobilinogen, Ur 0.2 0.2 - 1.0 mg/dL   Nitrite, UA Negative Negative   Microscopic Examination See below:    Urinalysis Reflex Comment       Assessment & Plan:   Problem List Items Addressed This Visit    None    Visit Diagnoses    Acute lower UTI    -  Primary   Await urine culture, tx empirically with bactrim in meantime. Drink plenty of fluids   Relevant Medications   sulfamethoxazole-trimethoprim (BACTRIM DS) 800-160 MG tablet   metroNIDAZOLE (FLAGYL) 500 MG tablet   Other Relevant Orders   UA/M w/rflx Culture,  Routine (Completed)   BV (bacterial vaginosis)       Wet prep +, tx with flagyl. Discussed good vaginal hygiene, probiotic regimen   Relevant Medications   sulfamethoxazole-trimethoprim (BACTRIM DS) 800-160 MG tablet   metroNIDAZOLE (FLAGYL) 500 MG tablet   Other Relevant Orders   WET PREP FOR TRICH, YEAST, CLUE (Completed)   Vaginal candidiasis       Diflucan sent, f/u if not improving   Relevant Medications   sulfamethoxazole-trimethoprim (BACTRIM DS) 800-160 MG tablet   metroNIDAZOLE (FLAGYL) 500 MG tablet       Follow up plan: Return if symptoms worsen or fail to improve.

## 2019-08-20 LAB — UA/M W/RFLX CULTURE, ROUTINE
Bilirubin, UA: NEGATIVE
Glucose, UA: NEGATIVE
Ketones, UA: NEGATIVE
Nitrite, UA: NEGATIVE
Specific Gravity, UA: 1.02 (ref 1.005–1.030)
Urobilinogen, Ur: 0.2 mg/dL (ref 0.2–1.0)
pH, UA: 6.5 (ref 5.0–7.5)

## 2019-08-20 LAB — MICROSCOPIC EXAMINATION

## 2019-08-20 LAB — URINE CULTURE, REFLEX

## 2019-10-16 IMAGING — DX DG TIBIA/FIBULA 2V*R*
2 series · 2 of 2 positions shown · non-contrast
Comparison: None.

CLINICAL DATA: RIGHT leg pain after mechanical fall this morning.

EXAM:
RIGHT TIBIA AND FIBULA - 2 VIEW

[tibia ap]
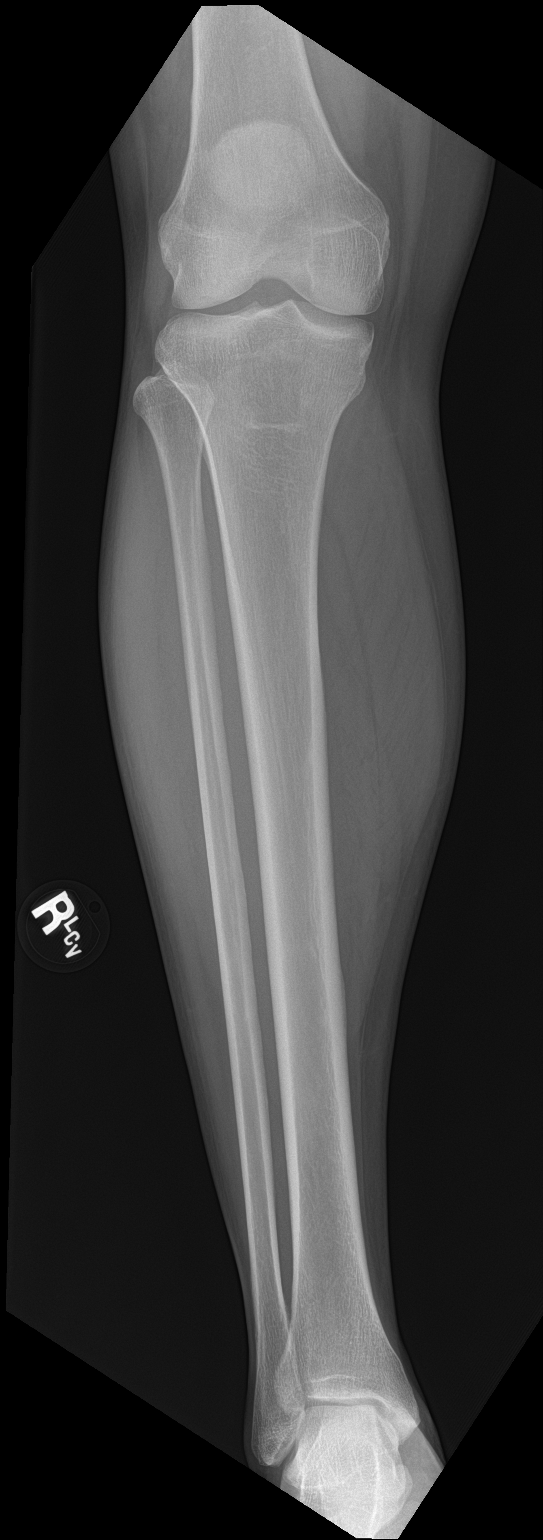

[tibia lat]
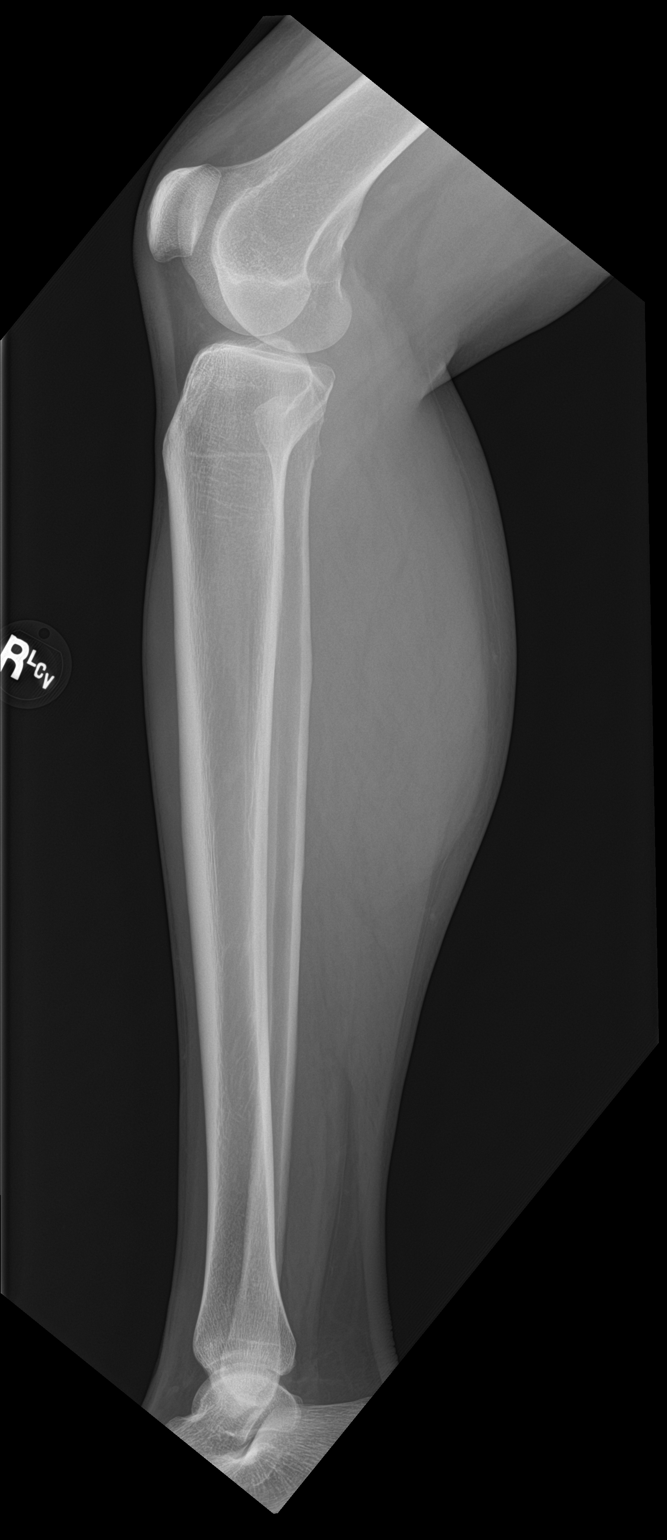

[2 of 2 positions shown; findings below may reference images not displayed]

FINDINGS: There is no evidence of fracture or other focal bone lesions. Soft
tissues are unremarkable.
IMPRESSION: Negative.

## 2019-11-04 ENCOUNTER — Ambulatory Visit: Payer: Medicaid Other | Admitting: Family Medicine

## 2019-12-03 ENCOUNTER — Other Ambulatory Visit: Payer: Self-pay

## 2019-12-03 NOTE — Telephone Encounter (Signed)
Last seen 08/17/2019 with Roosvelt Maser.

## 2019-12-03 NOTE — Telephone Encounter (Signed)
Last seen 08/17/2019 with Rachel Lane.  

## 2019-12-04 ENCOUNTER — Other Ambulatory Visit: Payer: Self-pay

## 2019-12-04 MED ORDER — HYDROCHLOROTHIAZIDE 25 MG PO TABS: 25.0000 mg | ORAL_TABLET | Freq: Every day | ORAL | 1 refills | Status: DC

## 2019-12-04 MED ORDER — AMLODIPINE BESYLATE 2.5 MG PO TABS: 2.5000 mg | ORAL_TABLET | Freq: Every day | ORAL | 1 refills | Status: DC

## 2019-12-04 MED ORDER — CETIRIZINE HCL 10 MG PO TABS: 10.0000 mg | ORAL_TABLET | Freq: Every day | ORAL | 0 refills | Status: DC

## 2019-12-04 MED ORDER — SERTRALINE HCL 50 MG PO TABS: ORAL_TABLET | ORAL | 0 refills | Status: DC

## 2019-12-04 MED ORDER — ALBUTEROL SULFATE HFA 108 (90 BASE) MCG/ACT IN AERS: 2.0000 | INHALATION_SPRAY | Freq: Four times a day (QID) | RESPIRATORY_TRACT | 2 refills | Status: DC | PRN

## 2019-12-04 NOTE — Telephone Encounter (Signed)
Last seen with Fleet Contras 08/17/2019.

## 2020-01-06 ENCOUNTER — Other Ambulatory Visit: Payer: Self-pay | Admitting: Unknown Physician Specialty

## 2020-01-06 NOTE — Telephone Encounter (Signed)
Request refused. This was a one time prescription written back in September '20.

## 2020-01-06 NOTE — Telephone Encounter (Signed)
Copied from CRM [id number]. Topic: Quick Communication - Rx Refill/Question >> Jan 06, 2020  3:20 PM Jaquita Rector A wrote: Medication: metroNIDAZOLE (FLAGYL) 500 MG tablet   Has the patient contacted their pharmacy? Yes.   (Agent: If no, request that the patient contact the pharmacy for the refill.) (Agent: If yes, when and what did the pharmacy advise?)  Preferred Pharmacy (with phone number or street name): [id number] [place], [place] - [address] AT [facility] [address] [facility]  Phone:  [phone] Fax:  [phone]     Agent: Please be advised that RX refills may take up to 3 business days. We ask that you follow-up with your pharmacy.

## 2020-01-22 ENCOUNTER — Ambulatory Visit (INDEPENDENT_AMBULATORY_CARE_PROVIDER_SITE_OTHER): Payer: Medicaid Other | Admitting: Family Medicine

## 2020-01-22 ENCOUNTER — Encounter: Payer: Self-pay | Admitting: Family Medicine

## 2020-01-22 ENCOUNTER — Other Ambulatory Visit: Payer: Self-pay

## 2020-01-22 VITALS — BP 184/96 | HR 97 | Temp 98.2°F | Wt 136.0 lb

## 2020-01-22 DIAGNOSIS — N898 Other specified noninflammatory disorders of vagina: Secondary | ICD-10-CM

## 2020-01-22 LAB — WET PREP FOR TRICH, YEAST, CLUE
Clue Cell Exam: POSITIVE — AB
Trichomonas Exam: POSITIVE — AB
Yeast Exam: NEGATIVE

## 2020-01-22 MED ORDER — METRONIDAZOLE 500 MG PO TABS: 500.0000 mg | ORAL_TABLET | Freq: Two times a day (BID) | ORAL | 0 refills | Status: DC

## 2020-01-22 NOTE — Progress Notes (Signed)
BP (!) 184/96   Pulse 97   Temp 98.2 F (36.8 C) (Oral)   Wt 136 lb (61.7 kg)   LMP  (LMP Unknown)   BMI 24.09 kg/m    Subjective:    Patient ID: Amy West, female    DOB: 04/16/1971, 49 y.o.   MRN: [id number]  HPI: Amy West is a 49 y.o. female  Chief Complaint  Patient presents with  . Vaginal Discharge    x 2 weeks   Patient presenting today for 2 weeks of white vaginal discharge and vaginal odor. Denies hematuria, dysuria, abdominal pain, N/V, fevers, concern for STIs or pregnancy. Not trying anything OTC. Hx of BV infections.   Relevant past medical, surgical, family and social history reviewed and updated as indicated. Interim medical history since our last visit reviewed. Allergies and medications reviewed and updated.  Review of Systems  Per HPI unless specifically indicated above     Objective:    BP (!) 184/96   Pulse 97   Temp 98.2 F (36.8 C) (Oral)   Wt 136 lb (61.7 kg)   LMP  (LMP Unknown)   BMI 24.09 kg/m   Wt Readings from Last 3 Encounters:  01/22/20 136 lb (61.7 kg)  03/06/19 165 lb (74.8 kg)  08/21/18 163 lb 1.6 oz (74 kg)    Physical Exam Vitals and nursing note reviewed.  Constitutional:      Appearance: Normal appearance. She is not ill-appearing.  HENT:     Head: Atraumatic.  Eyes:     Extraocular Movements: Extraocular movements intact.     Conjunctiva/sclera: Conjunctivae normal.  Cardiovascular:     Rate and Rhythm: Normal rate and regular rhythm.     Heart sounds: Normal heart sounds.  Pulmonary:     Effort: Pulmonary effort is normal.     Breath sounds: Normal breath sounds.  Abdominal:     General: Bowel sounds are normal. There is no distension.     Palpations: Abdomen is soft.     Tenderness: There is no abdominal tenderness. There is no right CVA tenderness, left CVA tenderness or guarding.  Genitourinary:    Vagina: Vaginal discharge present.  Musculoskeletal:        General: Normal range of motion.   Cervical back: Normal range of motion and neck supple.  Skin:    General: Skin is warm and dry.  Neurological:     Mental Status: She is alert and oriented to person, place, and time.  Psychiatric:        Mood and Affect: Mood normal.        Thought Content: Thought content normal.        Judgment: Judgment normal.     Results for orders placed or performed in visit on 01/22/20  WET PREP FOR [place], YEAST, CLUE   Specimen: Urine   URINE  Result Value Ref Range   Trichomonas Exam Positive (A) Negative   Yeast Exam Negative Negative   Clue Cell Exam Positive (A) Negative  Microscopic Examination   URINE  Result Value Ref Range   WBC, UA 0-5 0 - 5 /hpf   RBC 3-10 (A) 0 - 2 /hpf   Epithelial Cells (non renal) 0-10 0 - 10 /hpf   Mucus, UA Present Not Estab.   Bacteria, UA None seen None seen/Few  Urine Culture, Reflex   URINE  Result Value Ref Range   Urine Culture, Routine Final report    Organism ID, Bacteria Comment  UA/M w/rflx Culture, Routine   Specimen: Urine   URINE  Result Value Ref Range   Specific Gravity, UA 1.025 1.005 - 1.030   pH, UA 6.5 5.0 - 7.5   Color, UA Yellow Yellow   Appearance Ur Clear Clear   Leukocytes,UA 1+ (A) Negative   Protein,UA 1+ (A) Negative/Trace   Glucose, UA Negative Negative   Ketones, UA Trace (A) Negative   RBC, UA Trace (A) Negative   Bilirubin, UA Negative Negative   Urobilinogen, Ur 1.0 0.2 - 1.0 mg/dL   Nitrite, UA Negative Negative   Microscopic Examination See below:    Urinalysis Reflex Comment       Assessment & Plan:   Problem List Items Addressed This Visit    None    Visit Diagnoses    Vaginal discharge    -  Primary   Wet prep positive for BV and trichomonas. Tx wtih flagyl. probiotics, have partner tested/treated as well   Relevant Orders   UA/M w/rflx Culture, Routine (Completed)   WET PREP FOR TRICH, YEAST, CLUE (Completed)       Follow up plan: Return in about 2 weeks (around 02/05/2020) for 6 month  f/u .

## 2020-01-24 LAB — UA/M W/RFLX CULTURE, ROUTINE
Bilirubin, UA: NEGATIVE
Glucose, UA: NEGATIVE
Nitrite, UA: NEGATIVE
Specific Gravity, UA: 1.025 (ref 1.005–1.030)
Urobilinogen, Ur: 1 mg/dL (ref 0.2–1.0)
pH, UA: 6.5 (ref 5.0–7.5)

## 2020-01-24 LAB — URINE CULTURE, REFLEX

## 2020-01-24 LAB — MICROSCOPIC EXAMINATION: Bacteria, UA: NONE SEEN

## 2020-01-30 ENCOUNTER — Encounter: Payer: Self-pay | Admitting: Nurse Practitioner

## 2020-01-30 DIAGNOSIS — E119 Type 2 diabetes mellitus without complications: Secondary | ICD-10-CM | POA: Insufficient documentation

## 2020-01-30 DIAGNOSIS — IMO0002 Reserved for concepts with insufficient information to code with codable children: Secondary | ICD-10-CM | POA: Insufficient documentation

## 2020-02-05 ENCOUNTER — Ambulatory Visit: Payer: Medicaid Other | Admitting: Unknown Physician Specialty

## 2020-03-03 ENCOUNTER — Other Ambulatory Visit: Payer: Self-pay | Admitting: Nurse Practitioner

## 2020-03-08 ENCOUNTER — Ambulatory Visit: Payer: Medicaid Other | Admitting: Nurse Practitioner

## 2020-05-31 ENCOUNTER — Telehealth: Payer: Self-pay | Admitting: Unknown Physician Specialty

## 2020-05-31 NOTE — Telephone Encounter (Signed)
Pt has inquired office for general check up TB test etc. Pt series of cancel/noshows/ confered front desk states CRM to let provider decide on availability. Pt informed. PT FU #m [phone]

## 2020-06-01 NOTE — Telephone Encounter (Signed)
Pt is calling back and she is having vaginal discharge and itching

## 2020-06-02 ENCOUNTER — Ambulatory Visit: Payer: Medicaid Other | Admitting: Nurse Practitioner

## 2020-06-02 ENCOUNTER — Telehealth: Payer: Self-pay | Admitting: Family Medicine

## 2020-06-15 ENCOUNTER — Other Ambulatory Visit: Payer: Self-pay

## 2020-06-15 ENCOUNTER — Encounter: Payer: Self-pay | Admitting: Unknown Physician Specialty

## 2020-06-15 ENCOUNTER — Ambulatory Visit (INDEPENDENT_AMBULATORY_CARE_PROVIDER_SITE_OTHER): Payer: Medicaid Other | Admitting: Unknown Physician Specialty

## 2020-06-15 ENCOUNTER — Telehealth: Payer: Self-pay | Admitting: Unknown Physician Specialty

## 2020-06-15 VITALS — BP 127/89 | HR 69 | Temp 98.8°F | Ht 63.5 in | Wt 136.4 lb

## 2020-06-15 DIAGNOSIS — G894 Chronic pain syndrome: Secondary | ICD-10-CM

## 2020-06-15 DIAGNOSIS — N898 Other specified noninflammatory disorders of vagina: Secondary | ICD-10-CM | POA: Diagnosis not present

## 2020-06-15 DIAGNOSIS — I10 Essential (primary) hypertension: Secondary | ICD-10-CM | POA: Diagnosis not present

## 2020-06-15 DIAGNOSIS — Z111 Encounter for screening for respiratory tuberculosis: Secondary | ICD-10-CM

## 2020-06-15 DIAGNOSIS — B009 Herpesviral infection, unspecified: Secondary | ICD-10-CM

## 2020-06-15 DIAGNOSIS — F319 Bipolar disorder, unspecified: Secondary | ICD-10-CM | POA: Diagnosis not present

## 2020-06-15 DIAGNOSIS — J4520 Mild intermittent asthma, uncomplicated: Secondary | ICD-10-CM

## 2020-06-15 LAB — WET PREP FOR TRICH, YEAST, CLUE
Clue Cell Exam: POSITIVE — AB
Trichomonas Exam: NEGATIVE
Yeast Exam: POSITIVE — AB

## 2020-06-15 MED ORDER — HYDROCHLOROTHIAZIDE 25 MG PO TABS: 25.0000 mg | ORAL_TABLET | Freq: Every day | ORAL | 1 refills | Status: AC

## 2020-06-15 MED ORDER — METRONIDAZOLE 500 MG PO TABS: 500.0000 mg | ORAL_TABLET | Freq: Two times a day (BID) | ORAL | 10 refills | Status: AC

## 2020-06-15 MED ORDER — FLUCONAZOLE 150 MG PO TABS
150.0000 mg | ORAL_TABLET | Freq: Once | ORAL | 0 refills | Status: AC
Start: 2020-06-15 — End: 2020-06-15

## 2020-06-15 MED ORDER — AMLODIPINE BESYLATE 2.5 MG PO TABS: 2.5000 mg | ORAL_TABLET | Freq: Every day | ORAL | 1 refills | Status: AC

## 2020-06-15 MED ORDER — ALBUTEROL SULFATE HFA 108 (90 BASE) MCG/ACT IN AERS: 2.0000 | INHALATION_SPRAY | Freq: Four times a day (QID) | RESPIRATORY_TRACT | 2 refills | Status: AC | PRN

## 2020-06-15 MED ORDER — CETIRIZINE HCL 10 MG PO TABS: 10.0000 mg | ORAL_TABLET | Freq: Every day | ORAL | 0 refills | Status: AC

## 2020-06-15 NOTE — Telephone Encounter (Signed)
Patient stopped by office to request an appointment. Patient was a no show for appointment on 06/02/20.  Patient stated that she needed medication but stated she was aware she had been dismissed. I explained to patient that we would continue to take care of her for 30 days but she must find a new PCP. Patient's appointment scheduled for 06/15/20 at 1:20pm. Patient states she will show for appointment and find a new provider.

## 2020-06-15 NOTE — Assessment & Plan Note (Signed)
Stable, continue present medications.   

## 2020-06-15 NOTE — Addendum Note (Signed)
Addended by: Gabriel Cirri on: 06/15/2020 02:24 PM   Modules accepted: Orders

## 2020-06-15 NOTE — Assessment & Plan Note (Signed)
Refer to pain clinic? 

## 2020-06-15 NOTE — Patient Instructions (Signed)
[facility]  [facility]: [phone]

## 2020-06-15 NOTE — Assessment & Plan Note (Signed)
Note significant depression.  Refer to [facility] for further management

## 2020-06-15 NOTE — Progress Notes (Signed)
BP (!) 127/89 (BP Location: Left Arm, Cuff Size: Normal)   Pulse 69   Temp 98.8 F (37.1 C) (Oral)   Ht 5' 3.5" (1.613 m)   Wt 136 lb 6.4 oz (61.9 kg)   LMP  (LMP Unknown)   SpO2 98%   BMI 23.78 kg/m    Subjective:    Patient ID: Amy West, female    DOB: Aug 25, 1971, 49 y.o.   MRN: [id number]  HPI: Amy West is a 49 y.o. female  Chief Complaint  Patient presents with  . Medication Refill  . Labs Only    pt requesting to have Quantiferon Gold done  . Referral    pt requesting new referral to pain management  . Vaginal Discharge    pt states she has been having vaginal discharge for the past week    Bacterial vaginosis.  Diagnosed in the past and noticed increased discharge for about a week. It seems to be recurrent.  Wondering if she can have refills.  States she gets treated for it once or twice a month.  "hated" the metronidazole vaginal gel.  Needs medication for herpes.  No new partners but would like STD checks  Depression Pt states she has had low mood for a number of years.  States this is ongoing but has never been treated.  She is taking Sertraline that was prescribed from here.  States it is not really working and taking it intermittently.  She is concerned about weight loss though weight has been steady since January.    Depression screen [facility] 2/9 06/15/2020 10/16/2017  Decreased Interest 3 3  Down, Depressed, Hopeless 3 0  PHQ - 2 Score 6 3  Altered sleeping 3 0  Tired, decreased energy 3 3  Change in appetite 3 1  Feeling bad or failure about yourself  3 3  Trouble concentrating 0 0  Moving slowly or fidgety/restless 0 3  Suicidal thoughts 0 0  PHQ-9 Score 18 13  Difficult doing work/chores Somewhat difficult -    Pain management-  Would like to be referred to the pain clinic for low back, hip, neck pain.  She had been to the pain clinic in [place] but that physician is retired and would like to be referred closer to home.    Hypertension Using  medications without difficulty butt ran out for about a week Average home BPs now  No problems or lightheadedness No chest pain with exertion or shortness of breath No Edema  Herpes Takes Valtrex for maintenance.  One breakout this year.    Asthma Stable on current medications.  approx once per day inhaler use  Relevant past medical, surgical, family and social history reviewed and updated as indicated. Interim medical history since our last visit reviewed. Allergies and medications reviewed and updated.  Review of Systems  Per HPI unless specifically indicated above     Objective:    BP (!) 127/89 (BP Location: Left Arm, Cuff Size: Normal)   Pulse 69   Temp 98.8 F (37.1 C) (Oral)   Ht 5' 3.5" (1.613 m)   Wt 136 lb 6.4 oz (61.9 kg)   LMP  (LMP Unknown)   SpO2 98%   BMI 23.78 kg/m   Wt Readings from Last 3 Encounters:  06/15/20 136 lb 6.4 oz (61.9 kg)  01/22/20 136 lb (61.7 kg)  03/06/19 165 lb (74.8 kg)    Physical Exam Constitutional:      General: She is not in acute distress.  Appearance: Normal appearance. She is well-developed.  HENT:     Head: Normocephalic and atraumatic.  Eyes:     General: Lids are normal. No scleral icterus.       Right eye: No discharge.        Left eye: No discharge.     Conjunctiva/sclera: Conjunctivae normal.  Cardiovascular:     Rate and Rhythm: Normal rate.  Pulmonary:     Effort: Pulmonary effort is normal.  Abdominal:     Palpations: There is no hepatomegaly or splenomegaly.  Musculoskeletal:        General: Normal range of motion.  Skin:    Coloration: Skin is not pale.     Findings: No rash.  Neurological:     Mental Status: She is alert and oriented to person, place, and time.  Psychiatric:        Behavior: Behavior normal.        Thought Content: Thought content normal.        Judgment: Judgment normal.      Wet prep positive for yeast and clue cells  Assessment & Plan:   Problem List Items Addressed This  Visit      Unprioritized   Asthma    Stable, continue present medications.        Relevant Medications   albuterol (VENTOLIN HFA) 108 (90 Base) MCG/ACT inhaler   Bipolar 1 disorder, depressed (HCC)    Note significant depression.  Refer to [facility] for further management      Chronic pain    Refer to pain clinic      Relevant Orders   Ambulatory referral to Pain Clinic   Essential hypertension    Stable, continue present medications.        Relevant Medications   amLODipine (NORVASC) 2.5 MG tablet   hydrochlorothiazide (HYDRODIURIL) 25 MG tablet   Other Relevant Orders   Comprehensive metabolic panel   TSH   Lipid Panel w/o Chol/HDL Ratio   Herpes    Refill Valtrex for maintenance      Relevant Medications   metroNIDAZOLE (FLAGYL) 500 MG tablet   fluconazole (DIFLUCAN) 150 MG tablet    Other Visit Diagnoses    Vaginal discharge    -  Primary   Recurrent BV.  Metronidazole for 14 days and then refills   Relevant Orders   WET PREP FOR TRICH, YEAST, CLUE   C. trachomatis/N. gonorrhoeae RNA   HIV Antibody (routine testing w rflx)   RPR       Follow up plan: Return with another provider as discharged from this clinic due to no shows.

## 2020-06-15 NOTE — Assessment & Plan Note (Signed)
Refill Valtrex for maintenance

## 2020-06-15 NOTE — Telephone Encounter (Signed)
Late Entry: Patient called office on 06/01/20 requesting an appointment. Patient has had several no show appointments and was advised that she must show for scheduled appointment on 06/02/20 or would be dismissed as a patient per office policy. Patient acknowledge her understanding.

## 2020-06-16 ENCOUNTER — Other Ambulatory Visit: Payer: Self-pay | Admitting: Unknown Physician Specialty

## 2020-06-16 ENCOUNTER — Encounter: Payer: Self-pay | Admitting: Unknown Physician Specialty

## 2020-06-16 DIAGNOSIS — R7989 Other specified abnormal findings of blood chemistry: Secondary | ICD-10-CM

## 2020-06-16 LAB — TSH: TSH: 0.316 u[IU]/mL — ABNORMAL LOW (ref 0.450–4.500)

## 2020-06-16 LAB — COMPREHENSIVE METABOLIC PANEL
ALT: 12 IU/L (ref 0–32)
AST: 15 IU/L (ref 0–40)
Albumin/Globulin Ratio: 2 (ref 1.2–2.2)
Albumin: 4.5 g/dL (ref 3.8–4.8)
Alkaline Phosphatase: 110 IU/L (ref 48–121)
BUN/Creatinine Ratio: 15 (ref 9–23)
BUN: 14 mg/dL (ref 6–24)
Bilirubin Total: 0.4 mg/dL (ref 0.0–1.2)
CO2: 27 mmol/L (ref 20–29)
Calcium: 10.2 mg/dL (ref 8.7–10.2)
Chloride: 101 mmol/L (ref 96–106)
Creatinine, Ser: 0.93 mg/dL (ref 0.57–1.00)
GFR calc Af Amer: 83 mL/min/{1.73_m2} (ref >59)
GFR calc non Af Amer: 72 mL/min/{1.73_m2} (ref >59)
Globulin, Total: 2.3 g/dL (ref 1.5–4.5)
Glucose: 87 mg/dL (ref 65–99)
Potassium: 3.8 mmol/L (ref 3.5–5.2)
Sodium: 142 mmol/L (ref 134–144)
Total Protein: 6.8 g/dL (ref 6.0–8.5)

## 2020-06-16 LAB — LIPID PANEL W/O CHOL/HDL RATIO
Cholesterol, Total: 199 mg/dL (ref 100–199)
HDL: 63 mg/dL (ref >39)
LDL Chol Calc (NIH): 117 mg/dL — ABNORMAL HIGH (ref 0–99)
Triglycerides: 109 mg/dL (ref 0–149)
VLDL Cholesterol Cal: 19 mg/dL (ref 5–40)

## 2020-06-16 LAB — HIV ANTIBODY (ROUTINE TESTING W REFLEX): HIV Screen 4th Generation wRfx: NONREACTIVE

## 2020-06-16 LAB — RPR: RPR Ser Ql: NONREACTIVE

## 2020-06-18 LAB — QUANTIFERON-TB GOLD PLUS
QuantiFERON Mitogen Value: >10 IU/mL
QuantiFERON Nil Value: 0.17 IU/mL
QuantiFERON TB1 Ag Value: 0.18 IU/mL
QuantiFERON TB2 Ag Value: 0.23 IU/mL
QuantiFERON-TB Gold Plus: NEGATIVE

## 2020-06-29 ENCOUNTER — Other Ambulatory Visit: Payer: Self-pay

## 2020-07-24 ENCOUNTER — Emergency Department
Admission: EM | Admit: 2020-07-24 | Discharge: 2020-07-24 | Disposition: A | Discharge status: Home / Self Care | Payer: Medicaid Other | Attending: Emergency Medicine | Admitting: Emergency Medicine

## 2020-07-24 ENCOUNTER — Other Ambulatory Visit: Payer: Self-pay

## 2020-07-24 DIAGNOSIS — R1010 Upper abdominal pain, unspecified: Secondary | ICD-10-CM | POA: Insufficient documentation

## 2020-07-24 DIAGNOSIS — Z8719 Personal history of other diseases of the digestive system: Secondary | ICD-10-CM | POA: Diagnosis not present

## 2020-07-24 DIAGNOSIS — Z5321 Procedure and treatment not carried out due to patient leaving prior to being seen by health care provider: Secondary | ICD-10-CM | POA: Insufficient documentation

## 2020-07-24 DIAGNOSIS — R1110 Vomiting, unspecified: Secondary | ICD-10-CM | POA: Diagnosis not present

## 2020-07-24 LAB — COMPREHENSIVE METABOLIC PANEL
ALT: 14 U/L (ref 0–44)
AST: 18 U/L (ref 15–41)
Albumin: 3.5 g/dL (ref 3.5–5.0)
Alkaline Phosphatase: 91 U/L (ref 38–126)
Anion gap: 8 (ref 5–15)
BUN: 13 mg/dL (ref 6–20)
CO2: 27 mmol/L (ref 22–32)
Calcium: 9 mg/dL (ref 8.9–10.3)
Chloride: 106 mmol/L (ref 98–111)
Creatinine, Ser: 0.76 mg/dL (ref 0.44–1.00)
GFR calc Af Amer: >60 mL/min (ref >60)
GFR calc non Af Amer: >60 mL/min (ref >60)
Glucose, Bld: 103 mg/dL — ABNORMAL HIGH (ref 70–99)
Potassium: 3 mmol/L — ABNORMAL LOW (ref 3.5–5.1)
Sodium: 141 mmol/L (ref 135–145)
Total Bilirubin: 0.4 mg/dL (ref 0.3–1.2)
Total Protein: 6.3 g/dL — ABNORMAL LOW (ref 6.5–8.1)

## 2020-07-24 LAB — CBC
HCT: 40 % (ref 36.0–46.0)
Hemoglobin: 13.5 g/dL (ref 12.0–15.0)
MCH: 29.5 pg (ref 26.0–34.0)
MCHC: 33.8 g/dL (ref 30.0–36.0)
MCV: 87.3 fL (ref 80.0–100.0)
Platelets: 332 10*3/uL (ref 150–400)
RBC: 4.58 MIL/uL (ref 3.87–5.11)
RDW: 15.5 % (ref 11.5–15.5)
WBC: 12.2 10*3/uL — ABNORMAL HIGH (ref 4.0–10.5)
nRBC: 0 % (ref 0.0–0.2)

## 2020-07-24 LAB — URINALYSIS, COMPLETE (UACMP) WITH MICROSCOPIC
Bilirubin Urine: NEGATIVE
Glucose, UA: NEGATIVE mg/dL
Hgb urine dipstick: NEGATIVE
Ketones, ur: NEGATIVE mg/dL
Nitrite: NEGATIVE
Protein, ur: NEGATIVE mg/dL
Specific Gravity, Urine: 1.017 (ref 1.005–1.030)
WBC, UA: >50 WBC/hpf — ABNORMAL HIGH (ref 0–5)
pH: 6 (ref 5.0–8.0)

## 2020-07-24 LAB — LIPASE, BLOOD: Lipase: 34 U/L (ref 11–51)

## 2020-07-24 MED ORDER — ONDANSETRON HCL 4 MG/2ML IJ SOLN: 4.0000 mg | Freq: Once | INTRAMUSCULAR | Status: DC | PRN

## 2020-07-24 NOTE — ED Notes (Signed)
Pt to desk requesting IV be taken out. Pt states she is leaving. IV removed by this RN. Pt instructed the way to the lobby.

## 2020-07-24 NOTE — ED Triage Notes (Signed)
Pt arrived via [facility] from home with reports of upper abdominal pain radiating from the back. Pt states she has a hx of pancreatitis, but does not use alcohol.  Pt also had vomiting with EMS and was given 4mg  of zofran.

## 2020-07-24 NOTE — ED Notes (Signed)
Pt provided phone to use.

## 2020-07-25 ENCOUNTER — Telehealth: Payer: Self-pay | Admitting: Emergency Medicine

## 2020-07-25 NOTE — Telephone Encounter (Signed)
Called patient due to lwot to inquire about condition and follow up plans.  No answer and no voicemail  

## 2020-07-27 NOTE — Telephone Encounter (Signed)
Late entry:  Allowed patient appt within 30 day dismissal timeframe. Pt agrees to find a new PCP due to no shows.

## 2020-10-18 ENCOUNTER — Other Ambulatory Visit: Payer: Self-pay | Admitting: Nurse Practitioner

## 2020-10-18 NOTE — Telephone Encounter (Signed)
Requested Prescriptions  Pending Prescriptions Disp Refills   valACYclovir (VALTREX) 500 MG tablet [Pharmacy Med Name: VALACYCLOVIR 500MG  TABLETS] 90 tablet 1    Sig: TAKE 1 TABLET(500 MG) BY MOUTH DAILY     Antimicrobials:  Antiviral Agents - Anti-Herpetic Passed - 10/18/2020  4:56 PM      Passed - Valid encounter within last 12 months    Recent Outpatient Visits          4 months ago Vaginal discharge   [facility], NP   9 months ago Vaginal discharge   [facility], [place]   1 year ago Acute lower UTI   [facility] [place], [place]   1 year ago BV (bacterial vaginosis)   [facility] [place], [place], DO   2 years ago Essential hypertension   [facility] [place], [place], DO

## 2020-10-21 ENCOUNTER — Encounter: Payer: Self-pay | Admitting: Unknown Physician Specialty

## 2020-10-21 ENCOUNTER — Telehealth: Payer: Self-pay

## 2020-10-21 NOTE — Telephone Encounter (Signed)
Copied from CRM [id number]. Topic: General - Other >> Oct 21, 2020  3:09 PM Gwenlyn Fudge wrote: Reason for CRM: Pt called stating that she is needing to have a nurse give her a call back regarding her thyroid. Please advise.

## 2020-10-21 NOTE — Telephone Encounter (Signed)
Forwarding to office manager

## 2020-10-24 NOTE — Telephone Encounter (Signed)
Attempted to contact patient regarding thyroid question.  Patient's phone disconnects on third ring and does not provide voicemail as an option.

## 2020-10-24 NOTE — Telephone Encounter (Signed)
Made (3) attempts  to reach patient on 10/21/20 to obtain additional information regarding patient's thyroid question.  Phone hung up continuously on third ring.

## 2021-01-09 ENCOUNTER — Telehealth: Payer: Self-pay | Admitting: *Deleted

## 2021-01-09 NOTE — Telephone Encounter (Signed)
Transition Care Management Follow-up Telephone Call  Date of discharge and from where: 01/06/2021 [facility]  How have you been since you were released from the hospital? "Doing better"  Any questions or concerns? No  Items Reviewed:  Did the pt receive and understand the discharge instructions provided? Yes   Medications obtained and verified? Yes   Other? N/A  Any new allergies since your discharge? No   Dietary orders reviewed? Yes  Do you have support at home? Yes   Home Care and Equipment/Supplies: Were home health services ordered? not applicable If so, what is the name of the agency? N/A  Has the agency set up a time to come to the patient's home? not applicable Were any new equipment or medical supplies ordered?  No What is the name of the medical supply agency? N/A Were you able to get the supplies/equipment? not applicable Do you have any questions related to the use of the equipment or supplies? No  Functional Questionnaire: (I = Independent and D = Dependent) ADLs: I  Bathing/Dressing- I  Meal Prep- I  Eating- I  Maintaining continence- I  Transferring/Ambulation- I  Managing Meds- I  Follow up appointments reviewed:   PCP Hospital f/u appt confirmed? Pt will call to make appointment due to her work schedule   Specialist Hospital f/u appt confirmed? N/A   Are transportation arrangements needed? N/A  If their condition worsens, is the pt aware to call PCP or go to the Emergency Dept.? Yes  Was the patient provided with contact information for the PCP's office or ED? Yes  Was to pt encouraged to call back with questions or concerns? Yes

## 2021-01-25 DIAGNOSIS — Z23 Encounter for immunization: Secondary | ICD-10-CM | POA: Diagnosis not present

## 2021-08-28 ENCOUNTER — Other Ambulatory Visit: Payer: Self-pay | Admitting: Nurse Practitioner

## 2021-08-29 NOTE — Telephone Encounter (Signed)
Requested medication (s) are due for refill today- yes  Requested medication (s) are on the active medication list -yes  Future visit scheduled -no  Last refill: 10/18/20 #90 2RF  Notes to clinic: Attempted to contact patient- home number- disconnects, cell number- does not belong to patient Request sent for review   Requested Prescriptions  Pending Prescriptions Disp Refills   valACYclovir (VALTREX) 500 MG tablet [Pharmacy Med Name: VALACYCLOVIR 500MG  TABLETS] 90 tablet 2    Sig: TAKE 1 TABLET(500 MG) BY MOUTH DAILY     Antimicrobials:  Antiviral Agents - Anti-Herpetic Failed - 08/28/2021  5:47 PM      Failed - Valid encounter within last 12 months    Recent Outpatient Visits           1 year ago Vaginal discharge   [facility], NP   1 year ago Vaginal discharge   [facility], [place]   2 years ago Acute lower UTI   [facility], [place], [place]   2 years ago BV (bacterial vaginosis)   [facility] [place], [place], DO   3 years ago Essential hypertension   [facility] [place], [place], DO                 Requested Prescriptions  Pending Prescriptions Disp Refills   valACYclovir (VALTREX) 500 MG tablet [Pharmacy Med Name: VALACYCLOVIR 500MG  TABLETS] 90 tablet 2    Sig: TAKE 1 TABLET(500 MG) BY MOUTH DAILY     Antimicrobials:  Antiviral Agents - Anti-Herpetic Failed - 08/28/2021  5:47 PM      Failed - Valid encounter within last 12 months    Recent Outpatient Visits           1 year ago Vaginal discharge   [facility] 10/28/2021, NP   1 year ago Vaginal discharge   [facility] Gabriel Cirri, [facility]   2 years ago Acute lower UTI   [facility], [place], [facility]   2 years ago BV (bacterial vaginosis)   [facility] [place], [place], DO   3 years ago Essential hypertension    [facility] [place], [place], DO

## 2021-12-28 DIAGNOSIS — N898 Other specified noninflammatory disorders of vagina: Secondary | ICD-10-CM | POA: Diagnosis not present

## 2021-12-28 DIAGNOSIS — I10 Essential (primary) hypertension: Secondary | ICD-10-CM | POA: Diagnosis not present

## 2021-12-28 DIAGNOSIS — J4520 Mild intermittent asthma, uncomplicated: Secondary | ICD-10-CM | POA: Diagnosis not present

## 2021-12-28 DIAGNOSIS — M069 Rheumatoid arthritis, unspecified: Secondary | ICD-10-CM | POA: Diagnosis not present

## 2022-01-11 DIAGNOSIS — F1020 Alcohol dependence, uncomplicated: Secondary | ICD-10-CM | POA: Diagnosis not present

## 2022-01-12 DIAGNOSIS — F1020 Alcohol dependence, uncomplicated: Secondary | ICD-10-CM | POA: Diagnosis not present

## 2022-01-13 DIAGNOSIS — F1020 Alcohol dependence, uncomplicated: Secondary | ICD-10-CM | POA: Diagnosis not present

## 2022-02-19 DIAGNOSIS — Z23 Encounter for immunization: Secondary | ICD-10-CM | POA: Diagnosis not present

## 2022-02-19 DIAGNOSIS — Z1231 Encounter for screening mammogram for malignant neoplasm of breast: Secondary | ICD-10-CM | POA: Diagnosis not present

## 2022-02-19 DIAGNOSIS — Z1211 Encounter for screening for malignant neoplasm of colon: Secondary | ICD-10-CM | POA: Diagnosis not present

## 2022-02-19 DIAGNOSIS — Z1331 Encounter for screening for depression: Secondary | ICD-10-CM | POA: Diagnosis not present

## 2022-02-19 DIAGNOSIS — Z0000 Encounter for general adult medical examination without abnormal findings: Secondary | ICD-10-CM | POA: Diagnosis not present

## 2022-03-01 DIAGNOSIS — Z111 Encounter for screening for respiratory tuberculosis: Secondary | ICD-10-CM | POA: Diagnosis not present

## 2022-03-03 DIAGNOSIS — Z111 Encounter for screening for respiratory tuberculosis: Secondary | ICD-10-CM | POA: Diagnosis not present

## 2022-07-13 DIAGNOSIS — M069 Rheumatoid arthritis, unspecified: Secondary | ICD-10-CM | POA: Diagnosis not present

## 2022-07-13 DIAGNOSIS — G894 Chronic pain syndrome: Secondary | ICD-10-CM | POA: Diagnosis not present

## 2022-07-13 DIAGNOSIS — M533 Sacrococcygeal disorders, not elsewhere classified: Secondary | ICD-10-CM | POA: Diagnosis not present

## 2022-07-13 DIAGNOSIS — M7918 Myalgia, other site: Secondary | ICD-10-CM | POA: Diagnosis not present

## 2022-07-13 DIAGNOSIS — G8929 Other chronic pain: Secondary | ICD-10-CM | POA: Diagnosis not present

## 2022-08-05 DIAGNOSIS — R202 Paresthesia of skin: Secondary | ICD-10-CM | POA: Diagnosis not present

## 2022-08-05 DIAGNOSIS — M19012 Primary osteoarthritis, left shoulder: Secondary | ICD-10-CM | POA: Diagnosis not present

## 2022-08-05 DIAGNOSIS — M79632 Pain in left forearm: Secondary | ICD-10-CM | POA: Diagnosis not present

## 2022-08-05 DIAGNOSIS — M79622 Pain in left upper arm: Secondary | ICD-10-CM | POA: Diagnosis not present

## 2022-08-05 DIAGNOSIS — M25532 Pain in left wrist: Secondary | ICD-10-CM | POA: Diagnosis not present

## 2022-08-05 DIAGNOSIS — I10 Essential (primary) hypertension: Secondary | ICD-10-CM | POA: Diagnosis not present

## 2022-08-05 DIAGNOSIS — F17290 Nicotine dependence, other tobacco product, uncomplicated: Secondary | ICD-10-CM | POA: Diagnosis not present

## 2023-06-21 DIAGNOSIS — Z113 Encounter for screening for infections with a predominantly sexual mode of transmission: Secondary | ICD-10-CM | POA: Diagnosis not present

## 2023-06-21 DIAGNOSIS — R300 Dysuria: Secondary | ICD-10-CM | POA: Diagnosis not present

## 2023-07-11 DIAGNOSIS — Z5181 Encounter for therapeutic drug level monitoring: Secondary | ICD-10-CM | POA: Diagnosis not present

## 2023-07-11 DIAGNOSIS — M79602 Pain in left arm: Secondary | ICD-10-CM | POA: Diagnosis not present

## 2023-07-11 DIAGNOSIS — G8929 Other chronic pain: Secondary | ICD-10-CM | POA: Diagnosis not present

## 2023-07-11 DIAGNOSIS — G894 Chronic pain syndrome: Secondary | ICD-10-CM | POA: Diagnosis not present

## 2023-07-11 DIAGNOSIS — M79601 Pain in right arm: Secondary | ICD-10-CM | POA: Diagnosis not present

## 2023-07-11 DIAGNOSIS — Z79891 Long term (current) use of opiate analgesic: Secondary | ICD-10-CM | POA: Diagnosis not present

## 2023-07-11 DIAGNOSIS — M545 Low back pain, unspecified: Secondary | ICD-10-CM | POA: Diagnosis not present

## 2023-08-20 DIAGNOSIS — F315 Bipolar disorder, current episode depressed, severe, with psychotic features: Secondary | ICD-10-CM | POA: Diagnosis not present

## 2023-08-20 DIAGNOSIS — F411 Generalized anxiety disorder: Secondary | ICD-10-CM | POA: Diagnosis not present

## 2023-08-20 DIAGNOSIS — F4310 Post-traumatic stress disorder, unspecified: Secondary | ICD-10-CM | POA: Diagnosis not present

## 2024-03-14 DIAGNOSIS — F1020 Alcohol dependence, uncomplicated: Secondary | ICD-10-CM | POA: Diagnosis not present

## 2024-03-15 DIAGNOSIS — F1020 Alcohol dependence, uncomplicated: Secondary | ICD-10-CM | POA: Diagnosis not present

## 2024-03-16 DIAGNOSIS — F1020 Alcohol dependence, uncomplicated: Secondary | ICD-10-CM | POA: Diagnosis not present

## 2024-03-17 DIAGNOSIS — F1020 Alcohol dependence, uncomplicated: Secondary | ICD-10-CM | POA: Diagnosis not present

## 2024-03-18 DIAGNOSIS — F1020 Alcohol dependence, uncomplicated: Secondary | ICD-10-CM | POA: Diagnosis not present

## 2024-03-19 DIAGNOSIS — F1020 Alcohol dependence, uncomplicated: Secondary | ICD-10-CM | POA: Diagnosis not present

## 2024-03-24 DIAGNOSIS — I10 Essential (primary) hypertension: Secondary | ICD-10-CM | POA: Diagnosis not present

## 2024-03-24 DIAGNOSIS — Z136 Encounter for screening for cardiovascular disorders: Secondary | ICD-10-CM | POA: Diagnosis not present

## 2024-03-24 DIAGNOSIS — Z13220 Encounter for screening for lipoid disorders: Secondary | ICD-10-CM | POA: Diagnosis not present

## 2024-03-24 DIAGNOSIS — Z113 Encounter for screening for infections with a predominantly sexual mode of transmission: Secondary | ICD-10-CM | POA: Diagnosis not present

## 2024-03-24 DIAGNOSIS — J4520 Mild intermittent asthma, uncomplicated: Secondary | ICD-10-CM | POA: Diagnosis not present

## 2024-03-24 DIAGNOSIS — Z1159 Encounter for screening for other viral diseases: Secondary | ICD-10-CM | POA: Diagnosis not present

## 2024-03-24 DIAGNOSIS — Z131 Encounter for screening for diabetes mellitus: Secondary | ICD-10-CM | POA: Diagnosis not present

## 2024-03-24 DIAGNOSIS — N949 Unspecified condition associated with female genital organs and menstrual cycle: Secondary | ICD-10-CM | POA: Diagnosis not present

## 2024-03-27 DIAGNOSIS — F411 Generalized anxiety disorder: Secondary | ICD-10-CM | POA: Diagnosis not present

## 2024-03-27 DIAGNOSIS — F315 Bipolar disorder, current episode depressed, severe, with psychotic features: Secondary | ICD-10-CM | POA: Diagnosis not present

## 2024-03-27 DIAGNOSIS — F4310 Post-traumatic stress disorder, unspecified: Secondary | ICD-10-CM | POA: Diagnosis not present

## 2024-04-11 DIAGNOSIS — J020 Streptococcal pharyngitis: Secondary | ICD-10-CM | POA: Diagnosis not present

## 2024-04-27 DIAGNOSIS — M545 Low back pain, unspecified: Secondary | ICD-10-CM | POA: Diagnosis not present

## 2024-04-27 DIAGNOSIS — B009 Herpesviral infection, unspecified: Secondary | ICD-10-CM | POA: Diagnosis not present

## 2024-04-27 DIAGNOSIS — M5432 Sciatica, left side: Secondary | ICD-10-CM | POA: Diagnosis not present

## 2024-04-27 DIAGNOSIS — M542 Cervicalgia: Secondary | ICD-10-CM | POA: Diagnosis not present
